# Patient Record
Sex: Female | Born: 1960
Health system: Southern US, Community
[De-identification: ages and names within clinical notes are randomized; demographics above are authoritative.]

## PROBLEM LIST (undated history)

## (undated) DIAGNOSIS — R519 Headache, unspecified: Secondary | ICD-10-CM

## (undated) DIAGNOSIS — M1990 Unspecified osteoarthritis, unspecified site: Secondary | ICD-10-CM

## (undated) DIAGNOSIS — E079 Disorder of thyroid, unspecified: Secondary | ICD-10-CM

## (undated) DIAGNOSIS — T7840XA Allergy, unspecified, initial encounter: Secondary | ICD-10-CM

## (undated) DIAGNOSIS — B019 Varicella without complication: Secondary | ICD-10-CM

## (undated) DIAGNOSIS — R51 Headache: Secondary | ICD-10-CM

## (undated) DIAGNOSIS — Z87440 Personal history of urinary (tract) infections: Secondary | ICD-10-CM

## (undated) DIAGNOSIS — E785 Hyperlipidemia, unspecified: Secondary | ICD-10-CM

## (undated) HISTORY — PX: TONSILLECTOMY AND ADENOIDECTOMY: SHX28

## (undated) HISTORY — DX: Personal history of urinary (tract) infections: Z87.440

## (undated) HISTORY — DX: Disorder of thyroid, unspecified: E07.9

## (undated) HISTORY — DX: Varicella without complication: B01.9

## (undated) HISTORY — PX: ABDOMINAL HYSTERECTOMY: SHX81

## (undated) HISTORY — DX: Unspecified osteoarthritis, unspecified site: M19.90

## (undated) HISTORY — DX: Headache, unspecified: R51.9

## (undated) HISTORY — DX: Headache: R51

## (undated) HISTORY — DX: Hyperlipidemia, unspecified: E78.5

## (undated) HISTORY — DX: Allergy, unspecified, initial encounter: T78.40XA

---

## 2011-01-08 LAB — HM MAMMOGRAPHY: HM Mammogram: NEGATIVE

## 2014-01-11 LAB — HM PAP SMEAR: HM Pap smear: NORMAL

## 2015-04-25 ENCOUNTER — Encounter (INDEPENDENT_AMBULATORY_CARE_PROVIDER_SITE_OTHER): Payer: Self-pay

## 2015-04-25 ENCOUNTER — Ambulatory Visit (INDEPENDENT_AMBULATORY_CARE_PROVIDER_SITE_OTHER): Payer: BLUE CROSS/BLUE SHIELD | Admitting: Nurse Practitioner

## 2015-04-25 ENCOUNTER — Encounter: Payer: Self-pay | Admitting: Nurse Practitioner

## 2015-04-25 VITALS — BP 130/88 | HR 79 | Temp 99.0°F | Resp 16 | Ht 68.0 in | Wt 240.0 lb

## 2015-04-25 DIAGNOSIS — Z7189 Other specified counseling: Secondary | ICD-10-CM

## 2015-04-25 DIAGNOSIS — R519 Headache, unspecified: Secondary | ICD-10-CM | POA: Insufficient documentation

## 2015-04-25 DIAGNOSIS — R51 Headache: Secondary | ICD-10-CM

## 2015-04-25 DIAGNOSIS — G4489 Other headache syndrome: Secondary | ICD-10-CM

## 2015-04-25 DIAGNOSIS — Z7689 Persons encountering health services in other specified circumstances: Secondary | ICD-10-CM | POA: Insufficient documentation

## 2015-04-25 MED ORDER — ONDANSETRON 4 MG PO TBDP: 4.0000 mg | ORAL_TABLET | Freq: Three times a day (TID) | ORAL | Status: DC | PRN

## 2015-04-25 MED ORDER — VALACYCLOVIR HCL 1 G PO TABS: ORAL_TABLET | ORAL | Status: DC

## 2015-04-25 NOTE — Progress Notes (Signed)
Patient ID: Lori Crawford, female    DOB: [birth year]  Age: 54 y.o. MRN: [id number]  CC: Establish Care   HPI Lori Crawford presents for establishing care and CC of headache x 1 day.    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  Excedrin and valtrex (out of this) Started yesterday  Has cold sores and genital herpes- no current rash Chills frequently, "flurry of pain" Frontal pressure and pain, described as throbbing. Severe she reports Excedrin- took edge off. Nauseated.   History Lori Crawford has a past medical history of Arthritis; Allergy; Chicken pox; Sinus headache; Hyperlipidemia; Thyroid disease; and History of frequent urinary tract infections.   She has past surgical history that includes Tonsillectomy and adenoidectomy.   Her family history is not on file. She was adopted.She reports that she has never smoked. She has never used smokeless tobacco. She reports that she drinks alcohol. She reports that she does not use illicit drugs.  No outpatient prescriptions prior to visit.   No facility-administered medications prior to visit.    ROS Review of Systems  Constitutional: Negative for fever, chills, diaphoresis and fatigue.  HENT: Positive for sinus pressure.   Respiratory: Negative for chest tightness, shortness of breath and wheezing.   Cardiovascular: Negative for chest pain, palpitations and leg swelling.  Gastrointestinal: Positive for nausea. Negative for vomiting and diarrhea.  Skin: Negative for rash.  Neurological: Positive for headaches. Negative for dizziness, weakness and numbness.  Psychiatric/Behavioral: The patient is not nervous/anxious.     Objective:  BP 130/88 mmHg  Pulse 79  Temp(Src) 99 F (37.2 C)  Resp 16  Ht  (1.727 m)  Wt 240 lb (108.863 kg)  BMI 36.50 kg/m2  SpO2 94%  Physical Exam  Constitutional: She is oriented to person, place, and time. She appears well-developed and well-nourished. No distress.   HENT:  Head: Normocephalic and atraumatic.  Right Ear: External ear normal.  Left Ear: External ear normal.  Eyes: EOM are normal. Right eye exhibits no discharge. Left eye exhibits no discharge. No scleral icterus.  Cardiovascular: Normal rate, regular rhythm and normal heart sounds.   Pulmonary/Chest: Effort normal and breath sounds normal. No respiratory distress. She has no wheezes. She has no rales. She exhibits no tenderness.  Neurological: She is alert and oriented to person, place, and time. No cranial nerve deficit. She exhibits normal muscle tone. Coordination normal.  Skin: Skin is warm and dry. No rash noted. She is not diaphoretic.  Psychiatric: She has a normal mood and affect. Her behavior is normal. Judgment and thought content normal.   Assessment & Plan:   Lori Crawford was seen today for establish care.  Diagnoses and all orders for this visit:  Encounter to establish care  Other headache syndrome  Other orders -     valACYclovir (VALTREX) 1000 MG tablet; Take 1 tablet by mouth twice daily for 3 days then take 1 tablet by mouth daily. -     ondansetron (ZOFRAN ODT) 4 MG disintegrating tablet; Take 1 tablet (4 mg total) by mouth every 8 (eight) hours as needed for nausea or vomiting.  I have changed Lori Crawford valACYclovir. I am also having her start on ondansetron. Additionally, I am having her maintain her levothyroxine.  Meds ordered this encounter  Medications  . levothyroxine (SYNTHROID, LEVOTHROID) 125 MCG tablet    Sig: Take 125 mcg by mouth daily before breakfast.  . DISCONTD: valACYclovir (VALTREX) 1000 MG tablet    Sig:  Take 1,000 mg by mouth 3 (three) times daily.  . valACYclovir (VALTREX) 1000 MG tablet    Sig: Take 1 tablet by mouth twice daily for 3 days then take 1 tablet by mouth daily.    Dispense:  30 tablet    Refill:  4    Order Specific Question:  Supervising Provider    Answer:  Duncan Dull L [[id number]]  . ondansetron (ZOFRAN ODT) 4 MG  disintegrating tablet    Sig: Take 1 tablet (4 mg total) by mouth every 8 (eight) hours as needed for nausea or vomiting.    Dispense:  20 tablet    Refill:  0    Order Specific Question:  Supervising Provider    Answer:  Sherlene Shams [[id number]]     Follow-up: Return in about 2 weeks (around 05/09/2015) for Est care finish visit and groin strain.

## 2015-04-25 NOTE — Progress Notes (Signed)
Pre visit review using our clinic review tool, if applicable. No additional management support is needed unless otherwise documented below in the visit note. 

## 2015-04-25 NOTE — Assessment & Plan Note (Signed)
Have paperwork. Will follow through with patient in 2 weeks. Did not get around to discussing anything with her due to painful headache.

## 2015-04-25 NOTE — Patient Instructions (Addendum)
Zofran- for nausea sent to pharmacy Valtrex- sent to pharmacy [facility] [address]   Call me if not improved tomorrow.

## 2015-04-25 NOTE — Assessment & Plan Note (Signed)
Worsening. Pt in visible pain. Filled Valtrex and sent in Zofran for nausea. Toradol 60 mg injection given to pt. Asked pt not to take NSAIDS for 24 hours. Pt denies having gastric surgery, hx of ulcers, or hx of bleeding/clotting disorders. No prior MI or Stroke.

## 2015-04-26 ENCOUNTER — Other Ambulatory Visit: Payer: Self-pay | Admitting: Nurse Practitioner

## 2015-04-26 ENCOUNTER — Telehealth: Payer: Self-pay | Admitting: *Deleted

## 2015-04-26 MED ORDER — KETOROLAC TROMETHAMINE 10 MG PO TABS: 10.0000 mg | ORAL_TABLET | Freq: Four times a day (QID) | ORAL | Status: DC | PRN

## 2015-04-26 NOTE — Telephone Encounter (Signed)
She can either come in for a nurse visit to get another shot or I can send it in for oral tablets to take every 6 hours at home (max 4 a day and not to exceed 5 days). Thanks!

## 2015-04-26 NOTE — Telephone Encounter (Signed)
Patient has requested to have another dose of toridol. Patient stated that she feels a slight improvement,  She also stated that she can walk around without major pain. -thanks

## 2015-04-26 NOTE — Telephone Encounter (Signed)
Pt would like to have the oral [facility] [address]

## 2015-04-27 ENCOUNTER — Telehealth: Payer: Self-pay | Admitting: Nurse Practitioner

## 2015-04-27 NOTE — Telephone Encounter (Signed)
Pt call to request a work note for being out of work from 9/19-9/21. Pt stated that she will be work only a half day 9/22 and 9/23. Please advise pt when ready/msn

## 2015-04-28 ENCOUNTER — Encounter: Payer: Self-pay | Admitting: Nurse Practitioner

## 2015-04-28 ENCOUNTER — Telehealth: Payer: Self-pay

## 2015-04-28 NOTE — Telephone Encounter (Signed)
It is ready. I will give to [facility] to put up front.

## 2015-04-28 NOTE — Telephone Encounter (Signed)
Informed pt that her work letter is ready and will be at the front desk for her to pick up

## 2015-08-18 ENCOUNTER — Telehealth: Payer: Self-pay | Admitting: Nurse Practitioner

## 2015-08-18 NOTE — Telephone Encounter (Signed)
Please advise 

## 2015-08-18 NOTE — Telephone Encounter (Signed)
She needs her TSH,CBC, CRP need orders please and Thank You!

## 2015-08-18 NOTE — Telephone Encounter (Signed)
Ok. I called pt and left a vm to call office.  °

## 2015-08-18 NOTE — Telephone Encounter (Signed)
Pt is scheduled for her follow up appt for Monday.

## 2015-08-18 NOTE — Telephone Encounter (Signed)
She will get labs at the time of the physical when she makes her appointment. I will not place orders in advance

## 2015-08-18 NOTE — Telephone Encounter (Signed)
Pt called would like to have blood work done. She needs her TSH,CBC, CRP to test for heart disease,cholestrol. Call pt @ [phone]. Need orders please and thank you! Thank You!

## 2015-08-18 NOTE — Telephone Encounter (Signed)
Pt called back and stated she is better and does not need the follow up appt. She would rather come in just for the physical and lab work. Thank You!

## 2015-08-18 NOTE — Telephone Encounter (Signed)
She never followed up. She needs an appointment and come fasting for labs.

## 2015-08-18 NOTE — Telephone Encounter (Signed)
Can you please schedule patient for a follow up OV and a lab appt.  thanks

## 2015-08-22 ENCOUNTER — Ambulatory Visit (INDEPENDENT_AMBULATORY_CARE_PROVIDER_SITE_OTHER): Payer: BLUE CROSS/BLUE SHIELD | Admitting: Nurse Practitioner

## 2015-08-22 ENCOUNTER — Other Ambulatory Visit: Payer: BLUE CROSS/BLUE SHIELD

## 2015-08-22 ENCOUNTER — Encounter: Payer: Self-pay | Admitting: Nurse Practitioner

## 2015-08-22 VITALS — BP 118/86 | HR 79 | Temp 98.7°F | Resp 16 | Ht 68.0 in | Wt 242.0 lb

## 2015-08-22 DIAGNOSIS — E890 Postprocedural hypothyroidism: Secondary | ICD-10-CM

## 2015-08-22 DIAGNOSIS — M7751 Other enthesopathy of right foot: Secondary | ICD-10-CM

## 2015-08-22 NOTE — Patient Instructions (Addendum)
Please visit the lab today.   Thank you for signing the medical release forms  Follow up for a physical exam with fasting labs.  Bursitis Bursitis is inflammation and irritation of a bursa, which is one of the small, fluid-filled sacs that cushion and protect the moving parts of your body. These sacs are located between bones and muscles, muscle attachments, or skin areas next to bones. A bursa protects these structures from the wear and tear that results from frequent movement. An inflamed bursa causes pain and swelling. Fluid may build up inside the sac. Bursitis is most common near joints, especially the knees, elbows, hips, and shoulders. CAUSES Bursitis can be caused by:   Injury from:  A direct blow, like falling on your knee or elbow.  Overuse of a joint (repetitive stress).  Infection. This can happen if bacteria gets into a bursa through a cut or scrape near a joint.  Diseases that cause joint inflammation, such as gout and rheumatoid arthritis. RISK FACTORS You may be at risk for bursitis if you:   Have a job or hobby that involves a lot of repetitive stress on your joints.  Have a condition that weakens your body's defense system (immune system), such as diabetes, cancer, or HIV.  Lift and reach overhead often.  Kneel or lean on hard surfaces often.  Run or walk often. SIGNS AND SYMPTOMS The most common signs and symptoms of bursitis are:  Pain that gets worse when you move the affected body part or put weight on it.  Inflammation.  Stiffness. Other signs and symptoms may include:  Redness.  Tenderness.  Warmth.  Pain that continues after rest.  Fever and chills. This may occur in bursitis caused by infection. DIAGNOSIS Bursitis may be diagnosed by:   Medical history and physical exam.  MRI.  A procedure to drain fluid from the bursa with a needle (aspiration). The fluid may be checked for signs of infection or gout.  Blood tests to rule out  other causes of inflammation. TREATMENT  Bursitis can usually be treated at home with rest, ice, compression, and elevation (RICE). For mild bursitis, RICE treatment may be all you need. Other treatments may include:  Nonsteroidal anti-inflammatory drugs (NSAIDs) to treat pain and inflammation.  Corticosteroids to fight inflammation. You may have these drugs injected into and around the area of bursitis.  Aspiration of bursitis fluid to relieve pain and improve movement.  Antibiotic medicine to treat an infected bursa.  A splint, brace, or walking aid.  Physical therapy if you continue to have pain or limited movement.  Surgery to remove a damaged or infected bursa. This may be needed if you have a very bad case of bursitis or if other treatments have not worked. HOME CARE INSTRUCTIONS   Take medicines only as directed by your health care provider.  If you were prescribed an antibiotic medicine, finish it all even if you start to feel better.  Rest the affected area as directed by your health care provider.  Keep the area elevated.  Avoid activities that make pain worse.  Apply ice to the injured area:  Place ice in a plastic bag.  Place a towel between your skin and the bag.  Leave the ice on for 20 minutes, 2-3 times a day.  Use splints, braces, pads, or walking aids as directed by your health care provider.  Keep all follow-up visits as directed by your health care provider. This is important. PREVENTION   Wear  knee pads if you kneel often.  Wear sturdy running or walking shoes that fit you well.  Take regular breaks from repetitive activity.  Warm up by stretching before doing any strenuous activity.  Maintain a healthy weight or lose weight as recommended by your health care provider. Ask your health care provider if you need help.  Exercise regularly. Start any new physical activity gradually. SEEK MEDICAL CARE IF:   Your bursitis is not responding to  treatment or home care.  You have a fever.  You have chills.   This information is not intended to replace advice given to you by your health care provider. Make sure you discuss any questions you have with your health care provider.   Document Released: 07/20/2000 Document Revised: 04/13/2015 Document Reviewed: 10/12/2013 Elsevier Interactive Patient Education [employer]

## 2015-08-22 NOTE — Progress Notes (Signed)
Patient ID: Lori Crawford, female    DOB: Nov 13, 1960  Age: 55 y.o. MRN: [id number]  CC: Establish Care and Hypothyroidism   HPI Lori Crawford presents for follow up of hypothyroidism and CC of heel tenderness on right.   1) Thyroid surgery in past  Endocrinologist in [place]  1 week left thyroid medication  Has not had labs in sometime.   2) Pt reports for a few weeks having tenderness with certain movements and pressure on heel of right foot. Feels a vibrating sensation with flexion and dorsiflexion of foot. Tenderness is mild-moderate  Side note: she is getting Married this summer  HPI  Lori Crawford has a past medical history of Arthritis; Allergy; Chicken pox; Sinus headache; Hyperlipidemia; Thyroid disease; and History of frequent urinary tract infections.   She has past surgical history that includes Tonsillectomy and adenoidectomy.   Her family history is not on file. She was adopted.She reports that she has never smoked. She has never used smokeless tobacco. She reports that she drinks alcohol. She reports that she does not use illicit drugs.  Outpatient Prescriptions Prior to Visit  Medication Sig Dispense Refill  . valACYclovir (VALTREX) 1000 MG tablet Take 1 tablet by mouth twice daily for 3 days then take 1 tablet by mouth daily. 30 tablet 4  . levothyroxine (SYNTHROID, LEVOTHROID) 125 MCG tablet Take 125 mcg by mouth daily before breakfast.    . ketorolac (TORADOL) 10 MG tablet Take 1 tablet (10 mg total) by mouth every 6 (six) hours as needed. Maximum 4 tablets a day for 5 days (Patient not taking: Reported on 08/22/2015) 20 tablet 0  . ondansetron (ZOFRAN ODT) 4 MG disintegrating tablet Take 1 tablet (4 mg total) by mouth every 8 (eight) hours as needed for nausea or vomiting. (Patient not taking: Reported on 08/22/2015) 20 tablet 0   No facility-administered medications prior to visit.    ROS Review of Systems  Constitutional: Negative for fever, chills, diaphoresis and  fatigue.  Respiratory: Negative for chest tightness, shortness of breath and wheezing.   Cardiovascular: Negative for chest pain, palpitations and leg swelling.  Gastrointestinal: Negative for nausea, vomiting and diarrhea.  Endocrine: Negative for cold intolerance and heat intolerance.  Musculoskeletal: Positive for arthralgias. Negative for joint swelling.  Skin: Negative for rash.  Neurological: Negative for dizziness, weakness, numbness and headaches.  Psychiatric/Behavioral: The patient is not nervous/anxious.     Objective:  BP 118/86 mmHg  Pulse 79  Temp(Src) 98.7 F (37.1 C) (Oral)  Resp 16  Ht 5\' 8"  (1.727 m)  Wt 242 lb (109.77 kg)  BMI 36.80 kg/m2  SpO2 98%  Physical Exam  Constitutional: She is oriented to person, place, and time. She appears well-developed and well-nourished. No distress.  HENT:  Head: Normocephalic and atraumatic.  Right Ear: External ear normal.  Left Ear: External ear normal.  Cardiovascular: Normal rate, regular rhythm and normal heart sounds.  Exam reveals no gallop and no friction rub.   No murmur heard. Pulmonary/Chest: Effort normal and breath sounds normal. No respiratory distress. She has no wheezes. She has no rales. She exhibits no tenderness.  Musculoskeletal: Normal range of motion. She exhibits tenderness. She exhibits no edema.  Right heel at achilles area  Tenderness around the retrocalcaneal bursa No swelling or loss of ROM  Neurological: She is alert and oriented to person, place, and time. No cranial nerve deficit. She exhibits normal muscle tone. Coordination normal.  Skin: Skin is warm and dry. No rash noted. She is  not diaphoretic.  Psychiatric: She has a normal mood and affect. Her behavior is normal. Judgment and thought content normal.   Assessment & Plan:   Paulena was seen today for establish care and hypothyroidism.  Diagnoses and all orders for this visit:  Retrocalcaneal bursitis, right  Postoperative  hypothyroidism -     TSH -     T4, free -     T3   I have discontinued Ms. Lynde's ondansetron and ketorolac. I am also having her maintain her valACYclovir.  No orders of the defined types were placed in this encounter.     Follow-up: Return if symptoms worsen or fail to improve.

## 2015-08-23 ENCOUNTER — Other Ambulatory Visit: Payer: Self-pay | Admitting: Nurse Practitioner

## 2015-08-23 LAB — T3: T3, Total: 93.7 ng/dL (ref 80.0–204.0)

## 2015-08-23 LAB — TSH: TSH: 3.84 u[IU]/mL (ref 0.35–4.50)

## 2015-08-23 LAB — T4, FREE: Free T4: 1.02 ng/dL (ref 0.60–1.60)

## 2015-08-23 MED ORDER — LEVOTHYROXINE SODIUM 125 MCG PO TABS: 125.0000 ug | ORAL_TABLET | Freq: Every day | ORAL | Status: DC

## 2015-08-25 ENCOUNTER — Encounter: Payer: BLUE CROSS/BLUE SHIELD | Admitting: Nurse Practitioner

## 2015-08-26 DIAGNOSIS — M7750 Other enthesopathy of unspecified foot: Secondary | ICD-10-CM

## 2015-08-26 DIAGNOSIS — E039 Hypothyroidism, unspecified: Secondary | ICD-10-CM | POA: Insufficient documentation

## 2015-08-26 HISTORY — DX: Other enthesopathy of unspecified foot and ankle: M77.50

## 2015-08-26 NOTE — Assessment & Plan Note (Signed)
New onset  Probable Retrocalcaneal bursitis due to tenderness of site.  Gave handout, RICE encouraged FU prn worsening/failure to improve.

## 2015-08-26 NOTE — Assessment & Plan Note (Signed)
New problem.  Need thyroid panel checked and if okay a refill on medications. H/o of thyroid surgery

## 2015-09-19 ENCOUNTER — Encounter: Payer: BLUE CROSS/BLUE SHIELD | Admitting: Nurse Practitioner

## 2015-11-20 ENCOUNTER — Other Ambulatory Visit: Payer: Self-pay | Admitting: Nurse Practitioner

## 2016-01-30 ENCOUNTER — Ambulatory Visit: Payer: BLUE CROSS/BLUE SHIELD | Admitting: Primary Care

## 2016-05-17 ENCOUNTER — Other Ambulatory Visit: Payer: Self-pay | Admitting: Nurse Practitioner

## 2016-05-30 ENCOUNTER — Ambulatory Visit: Payer: BLUE CROSS/BLUE SHIELD | Admitting: Primary Care

## 2016-07-24 ENCOUNTER — Other Ambulatory Visit: Payer: Self-pay | Admitting: Nurse Practitioner

## 2016-08-08 ENCOUNTER — Encounter: Payer: Self-pay | Admitting: Primary Care

## 2016-08-08 ENCOUNTER — Ambulatory Visit (INDEPENDENT_AMBULATORY_CARE_PROVIDER_SITE_OTHER): Payer: BLUE CROSS/BLUE SHIELD | Admitting: Primary Care

## 2016-08-08 ENCOUNTER — Other Ambulatory Visit: Payer: Self-pay | Admitting: Primary Care

## 2016-08-08 VITALS — BP 122/78 | HR 76 | Temp 98.1°F | Ht 68.0 in | Wt 236.8 lb

## 2016-08-08 DIAGNOSIS — B009 Herpesviral infection, unspecified: Secondary | ICD-10-CM | POA: Diagnosis not present

## 2016-08-08 DIAGNOSIS — E669 Obesity, unspecified: Secondary | ICD-10-CM | POA: Insufficient documentation

## 2016-08-08 DIAGNOSIS — E039 Hypothyroidism, unspecified: Secondary | ICD-10-CM

## 2016-08-08 LAB — COMPREHENSIVE METABOLIC PANEL
ALT: 19 U/L (ref 0–35)
AST: 19 U/L (ref 0–37)
Albumin: 4.1 g/dL (ref 3.5–5.2)
Alkaline Phosphatase: 64 U/L (ref 39–117)
BUN: 15 mg/dL (ref 6–23)
CO2: 28 mEq/L (ref 19–32)
Calcium: 9.2 mg/dL (ref 8.4–10.5)
Chloride: 107 mEq/L (ref 96–112)
Creatinine, Ser: 0.77 mg/dL (ref 0.40–1.20)
GFR: 100.57 mL/min (ref >60.00)
Glucose, Bld: 95 mg/dL (ref 70–99)
Potassium: 4.6 mEq/L (ref 3.5–5.1)
Sodium: 140 mEq/L (ref 135–145)
Total Bilirubin: 0.3 mg/dL (ref 0.2–1.2)
Total Protein: 7 g/dL (ref 6.0–8.3)

## 2016-08-08 LAB — TSH: TSH: 5.86 u[IU]/mL — ABNORMAL HIGH (ref 0.35–4.50)

## 2016-08-08 MED ORDER — LEVOTHYROXINE SODIUM 125 MCG PO TABS: ORAL_TABLET | ORAL | 1 refills | Status: DC

## 2016-08-08 MED ORDER — VALACYCLOVIR HCL 500 MG PO TABS: 500.0000 mg | ORAL_TABLET | Freq: Every day | ORAL | 3 refills | Status: DC

## 2016-08-08 NOTE — Progress Notes (Signed)
Subjective:    Patient ID: Lori Crawford, female    DOB: 1961-03-07, 56 y.o.   MRN: [id number]  HPI  Ms. Lori MaudlinGrimes is a 56 year old female who presents today [facility]. Her last physical was over 1 year ago.  1) Hypothyroidism: Diagnosed in her 40's. Currently managed on Levothyroxine 125 mcg. Her last TSH was stable in January 2017. She's not had a dose adjustment in several years. She is due for repeat TSH today. She does have a difficulty time losing weight. She denies fatigue, weakness, palpitations.  2) Herpes Simplex: Experiences lesions to buttocks and face, especially during stressful events. 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 She takes 500 mg of Valtrex daily for prevention and is needing a refill.   3) Obesity: She's struggled with obesity for the past 10-12 years after her divorce. She is trying to adapt a plant based diet that she started severl months ago. She doesn't count calories and does like to snack a lot at night.  She endorses a fair diet: Breakfast: Coffee, smoothie,  Lunch: Vegetables Dinner: Vegetables, occasional chicken Snack: Peanuts, chips, humus Desserts: Three times weekly Beverages: Coffee, smoothie, little water, soda (small can), occasional alcohol  Exercise: She does not currently exercise, but plans on exercising.   Review of Systems  Constitutional: Negative for fatigue.  Respiratory: Negative for shortness of breath.   Cardiovascular: Negative for chest pain.  Skin: Negative for rash.       Past Medical History:  Diagnosis Date  . Allergy    Seasonal   . Arthritis   . Chicken pox   . History of frequent urinary tract infections   . Hyperlipidemia   . Sinus headache   . Thyroid disease      Social History   Social History  . Marital status: Single    Spouse name: N/A  . Number of children: N/A  . Years of education: N/A   Occupational History  .  Not on file.   Social History Main Topics  . Smoking status: Never Smoker  . Smokeless tobacco: Never Used  . Alcohol use Yes  . Drug use: No  . Sexual activity: Not on file   Other Topics Concern  . Not on file   Social History Narrative  . No narrative on file    Past Surgical History:  Procedure Laterality Date  . TONSILLECTOMY AND ADENOIDECTOMY      Family History  Problem Relation Age of Onset  . Adopted: Yes    No Known Allergies  Current Outpatient Prescriptions on File Prior to Visit  Medication Sig Dispense Refill  . levothyroxine (SYNTHROID, LEVOTHROID) 125 MCG tablet TAKE 1 TABLET (125 MCG TOTAL) BY MOUTH DAILY BEFORE BREAKFAST. 90 tablet 1   No current facility-administered medications on file prior to visit.     BP 122/78   Pulse 76   Temp 98.1 F (36.7 C) (Oral)   Ht 5\' 8"  (1.727 m)   Wt 236 lb 12.8 oz (107.4 kg)   SpO2 97%   BMI 36.01 kg/m    Objective:   Physical Exam  Constitutional: She appears well-nourished.  Neck: Neck supple. No thyromegaly present.  Cardiovascular: Normal rate and regular rhythm.   Pulmonary/Chest: Effort normal and breath sounds normal.  Skin: Skin is warm and dry.  Psychiatric: She has a normal mood and affect.  Assessment & Plan:

## 2016-08-08 NOTE — Assessment & Plan Note (Signed)
History of genital herpes, also with lesions to face and chest wall. Managed on valtrex 500 mg daily, refill provided. No recent outbreaks.

## 2016-08-08 NOTE — Assessment & Plan Note (Signed)
Difficulty losing weight for years. Diet overall okay, but needs to eliminate sodas and snacking. discussed to start exercising daily. Discussed to start calorie tracking. Discussed weight loss MD in [place].

## 2016-08-08 NOTE — Patient Instructions (Signed)
Complete lab work prior to leaving today. I will notify you of your results once received.   I sent a prescription for Valtrex 500 mg tablets to your pharmacy. Take 1 tablet by mouth daily.  I will refill your levothyroxine once I receive your lab result.  Lori Crawford is the name of the weight loss management doctor in [place]. Please e-mail or call me if you need a referral.  Try counting your calories temporarily. Download the APP "My Fitness Pal".  Increase vegetables, fruit, whole grains, and water.  Ensure you are consuming 64 ounces of water daily.  Start exercising. You should be getting 150 minutes of moderate intensity exercise weekly.  It was a pleasure to meet you today! Please don't hesitate to call me with any questions. [facility]!

## 2016-08-08 NOTE — Assessment & Plan Note (Signed)
TSH due and pending. Exam unremarkable. Will refill levothyroxine once labs received.

## 2016-08-08 NOTE — Progress Notes (Signed)
Pre visit review using our clinic review tool, if applicable. No additional management support is needed unless otherwise documented below in the visit note. 

## 2016-09-14 ENCOUNTER — Other Ambulatory Visit (INDEPENDENT_AMBULATORY_CARE_PROVIDER_SITE_OTHER): Payer: BLUE CROSS/BLUE SHIELD

## 2016-09-14 ENCOUNTER — Encounter (INDEPENDENT_AMBULATORY_CARE_PROVIDER_SITE_OTHER): Payer: Self-pay

## 2016-09-14 DIAGNOSIS — E039 Hypothyroidism, unspecified: Secondary | ICD-10-CM

## 2016-09-14 LAB — TSH: TSH: 1.52 mIU/L

## 2016-10-16 ENCOUNTER — Other Ambulatory Visit: Payer: Self-pay | Admitting: Primary Care

## 2016-10-16 DIAGNOSIS — E039 Hypothyroidism, unspecified: Secondary | ICD-10-CM

## 2016-12-13 ENCOUNTER — Ambulatory Visit: Payer: BLUE CROSS/BLUE SHIELD | Admitting: Primary Care

## 2017-04-13 ENCOUNTER — Other Ambulatory Visit: Payer: Self-pay | Admitting: Primary Care

## 2017-04-13 DIAGNOSIS — E039 Hypothyroidism, unspecified: Secondary | ICD-10-CM

## 2017-06-27 ENCOUNTER — Encounter: Payer: Self-pay | Admitting: Emergency Medicine

## 2017-06-27 ENCOUNTER — Emergency Department: Payer: BLUE CROSS/BLUE SHIELD

## 2017-06-27 DIAGNOSIS — J181 Lobar pneumonia, unspecified organism: Secondary | ICD-10-CM | POA: Insufficient documentation

## 2017-06-27 DIAGNOSIS — J189 Pneumonia, unspecified organism: Secondary | ICD-10-CM | POA: Diagnosis not present

## 2017-06-27 DIAGNOSIS — R05 Cough: Secondary | ICD-10-CM | POA: Diagnosis not present

## 2017-06-27 DIAGNOSIS — Z79899 Other long term (current) drug therapy: Secondary | ICD-10-CM | POA: Diagnosis not present

## 2017-06-27 DIAGNOSIS — E039 Hypothyroidism, unspecified: Secondary | ICD-10-CM | POA: Diagnosis not present

## 2017-06-27 NOTE — ED Triage Notes (Signed)
Pt arrived via pov with complaints of persistant cough.. Pt spoke with teleMD today and was given a Zpack and benzonatate. Pt states she came in tonight because she has had no relief. Pt states symptoms started Monday.

## 2017-06-28 ENCOUNTER — Emergency Department
Admission: EM | Admit: 2017-06-28 | Discharge: 2017-06-28 | Disposition: A | Discharge status: Home / Self Care | Payer: BLUE CROSS/BLUE SHIELD | Attending: Emergency Medicine | Admitting: Emergency Medicine

## 2017-06-28 DIAGNOSIS — R05 Cough: Secondary | ICD-10-CM

## 2017-06-28 DIAGNOSIS — J181 Lobar pneumonia, unspecified organism: Secondary | ICD-10-CM

## 2017-06-28 DIAGNOSIS — R059 Cough, unspecified: Secondary | ICD-10-CM

## 2017-06-28 DIAGNOSIS — J189 Pneumonia, unspecified organism: Secondary | ICD-10-CM

## 2017-06-28 MED ORDER — GUAIFENESIN ER 600 MG PO TB12
600.0000 mg | ORAL_TABLET | Freq: Two times a day (BID) | ORAL | 0 refills | Status: DC
Start: 2017-06-28 — End: 2018-02-28

## 2017-06-28 MED ORDER — HYDROCOD POLST-CPM POLST ER 10-8 MG/5ML PO SUER: 5.0000 mL | Freq: Two times a day (BID) | ORAL | 0 refills | Status: DC

## 2017-06-28 MED ORDER — GUAIFENESIN ER 600 MG PO TB12
600.0000 mg | ORAL_TABLET | Freq: Two times a day (BID) | ORAL | Status: DC
  Administered 2017-06-28: 600 mg via ORAL
  Filled 2017-06-28 (×2): qty 1

## 2017-06-28 MED ORDER — IBUPROFEN 600 MG PO TABS
600.0000 mg | ORAL_TABLET | Freq: Once | ORAL | Status: AC
  Administered 2017-06-28: 600 mg via ORAL
  Filled 2017-06-28: qty 1

## 2017-06-28 MED ORDER — DM-GUAIFENESIN ER 30-600 MG PO TB12: 1.0000 | ORAL_TABLET | Freq: Two times a day (BID) | ORAL | Status: DC

## 2017-06-28 MED ORDER — DEXTROMETHORPHAN POLISTIREX ER 30 MG/5ML PO SUER
30.0000 mg | Freq: Two times a day (BID) | ORAL | Status: DC
  Administered 2017-06-28: 30 mg via ORAL
  Filled 2017-06-28 (×2): qty 5

## 2017-06-28 NOTE — ED Notes (Signed)
Notified pharmacy to send patient ordered medications

## 2017-06-28 NOTE — ED Provider Notes (Signed)
[facility] Emergency Department Provider Note   ____________________________________________   First MD Initiated Contact with Patient 06/28/17 0107     (approximate)  I have reviewed the triage vital signs and the nursing notes.   HISTORY  Chief Complaint Cough    HPI Lori Crawford is a 56 y.o. female who comes into the hospital today with a chronic cough.  She reports that she has been coughing so hard now she has pain in her back.  The cough started approximately 4 days ago.  She reports that 3 days ago she thought she was having some flulike symptoms with fevers at home.  She states that the fevers are better but today she called the tele-doc and she was prescribed a z pak and benzonatate.  The patient states that the cough medicine though has not been helping.  She did take a dose of the antibiotics.  She reports that she is fatigued and the cough takes her breath away.  She reports that her eyes are red and now starting to have some drainage.  The patient again was concerned so she decided to come into the hospital today for evaluation.  She denies any nausea or vomiting and denies shortness of breath.  She only has the pain in her back when she coughs.  She is here for evaluation.   Past Medical History:  Diagnosis Date  . Allergy    Seasonal   . Arthritis   . Chicken pox   . History of frequent urinary tract infections   . Hyperlipidemia   . Sinus headache   . Thyroid disease     Patient Active Problem List   Diagnosis Date Noted  . Herpes simplex 08/08/2016  . Obesity (BMI 35.0-39.9 without comorbidity) 08/08/2016  . Hypothyroidism 08/26/2015  . Retrocalcaneal bursitis 08/26/2015  . Headache 04/25/2015    Past Surgical History:  Procedure Laterality Date  . TONSILLECTOMY AND ADENOIDECTOMY      Prior to Admission medications   Medication Sig Start Date End Date Taking? Authorizing Provider  chlorpheniramine-HYDROcodone (TUSSIONEX  PENNKINETIC ER) 10-8 MG/5ML SUER Take 5 mLs by mouth 2 (two) times daily. 06/28/17   Rebecka ApleyWebster, Jammie Troup P, MD  guaiFENesin (MUCINEX) 600 MG 12 hr tablet Take 1 tablet (600 mg total) by mouth 2 (two) times daily. 06/28/17 06/28/18  Rebecka ApleyWebster, Natacha Jepsen P, MD  levothyroxine (SYNTHROID, LEVOTHROID) 125 MCG tablet TAKE 1 TABLET (125 MCG TOTAL) BY MOUTH DAILY BEFORE BREAKFAST. 04/15/17   Doreene Nestlark, Katherine K, NP  valACYclovir (VALTREX) 500 MG tablet Take 1 tablet (500 mg total) by mouth daily. 08/08/16   Doreene Nestlark, Katherine K, NP    Allergies Patient has no known allergies.  Family History  Adopted: Yes    Social History Social History   Tobacco Use  . Smoking status: Never Smoker  . Smokeless tobacco: Never Used  Substance Use Topics  . Alcohol use: Yes  . Drug use: No    Review of Systems  Constitutional: No fever/chills Eyes: No visual changes. ENT: No sore throat. Cardiovascular: Denies chest pain. Respiratory: Cough Gastrointestinal: No abdominal pain.  No nausea, no vomiting.  No diarrhea.  No constipation. Genitourinary: Negative for dysuria. Musculoskeletal: Right-sided back pain Skin: Negative for rash. Neurological: Negative for headaches, focal weakness or numbness.   ____________________________________________   PHYSICAL EXAM:  VITAL SIGNS: ED Triage Vitals  Enc Vitals Group     BP 06/27/17 2255 131/74     Pulse Rate 06/27/17 2255 96  Resp 06/27/17 2255 20     Temp 06/27/17 2255 100.1 F (37.8 C)     Temp Source 06/27/17 2255 Oral     SpO2 06/27/17 2255 98 %     Weight 06/27/17 2256 225 lb (102.1 kg)     Height 06/27/17 2256 5\' 8"  (1.727 m)     Head Circumference --      Peak Flow --      Pain Score 06/27/17 2255 0     Pain Loc --      Pain Edu? --      Excl. in GC? --     Constitutional: Alert and oriented. Well appearing and in mild distress. Eyes: Conjunctivae are normal. PERRL. EOMI. Head: Atraumatic. Nose: No congestion/rhinnorhea. Mouth/Throat:  Mucous membranes are moist.  Oropharynx non-erythematous. Cardiovascular: Normal rate, regular rhythm. Grossly normal heart sounds.  Good peripheral circulation. Respiratory: Normal respiratory effort.  No retractions. Mild crackles in RUL Gastrointestinal: Soft and nontender. No distention. Positive bowel sounds Musculoskeletal: No lower extremity tenderness nor edema.   Neurologic:  Normal speech and language.  Skin:  Skin is warm, dry and intact.  Psychiatric: Mood and affect are normal.   ____________________________________________   LABS (all labs ordered are listed, but only abnormal results are displayed)  Labs Reviewed - No data to display ____________________________________________  EKG  none ____________________________________________  RADIOLOGY  Dg Chest 2 View  Result Date: 06/27/2017 CLINICAL DATA:  Persistent cough starting on Monday. No relief with antibiotics today. EXAM: CHEST  2 VIEW COMPARISON:  None. FINDINGS: There is focal airspace disease in the inferior anterior right upper lung consistent with focal pneumonia. Left lung is clear. Heart size and pulmonary vascularity are normal. Slight blunting of the costophrenic angles suggesting small pleural effusions. No pneumothorax. Mediastinal contours appear intact. IMPRESSION: Focal infiltration in the inferior right upper lung consistent with pneumonia. Electronically Signed   By: Burman Nieves M.D.   On: 06/27/2017 23:14    ____________________________________________   PROCEDURES  Procedure(s) performed: None  Procedures  Critical Care performed: No  ____________________________________________   INITIAL IMPRESSION / ASSESSMENT AND PLAN / ED COURSE  As part of my medical decision making, I reviewed the following data within the electronic MEDICAL RECORD NUMBER Notes from prior ED visits and [place] Controlled Substance Database   This is a 57 year old female who comes into the hospital today with a  cough.  She reports that she has been having this since Monday and it is not gotten any better.  We did send the patient for a chest x-ray with a concern for possible pneumonia versus bronchitis.  The patient's chest x-ray showed a focal infiltration in the right inferior upper lung consistent with pneumonia.  I explained to the patient that this cough is due to her pneumonia.  She is on the appropriate antibiotic regimen but I will change her cough medicine.  The patient will receive a dose of Mucinex DM here in the emergency department.  I also gave her some ibuprofen for her pain.  The patient will be discharged home with some Tussionex and she should follow back up with her primary care physician.  The patient has no further complaints or concerns at this time.      ____________________________________________   FINAL CLINICAL IMPRESSION(S) / ED DIAGNOSES  Final diagnoses:  Community acquired pneumonia of right upper lobe of lung (HCC)  Cough     ED Discharge Orders        Ordered  chlorpheniramine-HYDROcodone (TUSSIONEX PENNKINETIC ER) 10-8 MG/5ML SUER  2 times daily     06/28/17 0142    guaiFENesin (MUCINEX) 600 MG 12 hr tablet  2 times daily     06/28/17 0142       Note:  This document was prepared using Dragon voice recognition software and may include unintentional dictation errors.    Rebecka ApleyWebster, Pauletta Pickney P, MD 06/28/17 [phone]

## 2017-06-28 NOTE — Discharge Instructions (Signed)
You have pneumonia. Please ensure that you are resting, using your humidifier and drinking adequate amounts of fluids. Please use tylenol and ibuprofen for your pain at home and please follow up with your primary care physician. Please return to the ED with any further complaints or concerns.

## 2017-07-01 ENCOUNTER — Other Ambulatory Visit: Payer: BLUE CROSS/BLUE SHIELD

## 2017-07-01 ENCOUNTER — Ambulatory Visit (INDEPENDENT_AMBULATORY_CARE_PROVIDER_SITE_OTHER): Payer: BLUE CROSS/BLUE SHIELD | Admitting: Family Medicine

## 2017-07-01 ENCOUNTER — Ambulatory Visit: Payer: Self-pay

## 2017-07-01 ENCOUNTER — Encounter: Payer: Self-pay | Admitting: Family Medicine

## 2017-07-01 VITALS — BP 122/80 | HR 78 | Temp 98.8°F | Wt 229.5 lb

## 2017-07-01 DIAGNOSIS — R05 Cough: Secondary | ICD-10-CM

## 2017-07-01 DIAGNOSIS — R062 Wheezing: Secondary | ICD-10-CM | POA: Diagnosis not present

## 2017-07-01 DIAGNOSIS — R059 Cough, unspecified: Secondary | ICD-10-CM

## 2017-07-01 DIAGNOSIS — J189 Pneumonia, unspecified organism: Secondary | ICD-10-CM

## 2017-07-01 MED ORDER — ALBUTEROL SULFATE (2.5 MG/3ML) 0.083% IN NEBU
2.5000 mg | INHALATION_SOLUTION | Freq: Once | RESPIRATORY_TRACT | Status: AC
  Administered 2017-07-01: 2.5 mg via RESPIRATORY_TRACT

## 2017-07-01 MED ORDER — ALBUTEROL SULFATE HFA 108 (90 BASE) MCG/ACT IN AERS: 2.0000 | INHALATION_SPRAY | RESPIRATORY_TRACT | 1 refills | Status: DC | PRN

## 2017-07-01 MED ORDER — IPRATROPIUM BROMIDE 0.02 % IN SOLN
0.5000 mg | Freq: Once | RESPIRATORY_TRACT | Status: AC
  Administered 2017-07-01: 0.5 mg via RESPIRATORY_TRACT

## 2017-07-01 NOTE — Progress Notes (Signed)
Subjective:    Patient ID: Lori Crawford, female    DOB: 07/30/1961, 56 y.o.   MRN: [id number]  HPI This is a 56 yo female who presents today for ER follow up. Lori Crawford did a tele visit and was given Z-pack and tessalon perles (did not help), Lori Crawford went to ER and was given Tussionex (good relief of cough at night) and guaifenesin. Lori Crawford is doing better, sputum production much reduced. Cough is dry. Has difficulty stopping cough once Lori Crawford starts, chest feels tight. Hearing wheezes.  Headache with coughing, no sore throat or ear pain. No fever.   Past Medical History:  Diagnosis Date  . Allergy    Seasonal   . Arthritis   . Chicken pox   . History of frequent urinary tract infections   . Hyperlipidemia   . Sinus headache   . Thyroid disease    Past Surgical History:  Procedure Laterality Date  . TONSILLECTOMY AND ADENOIDECTOMY     Family History  Adopted: Yes   Social History   Tobacco Use  . Smoking status: Never Smoker  . Smokeless tobacco: Never Used  Substance Use Topics  . Alcohol use: Yes  . Drug use: No      Review of Systems Per HPI    Objective:   Physical Exam  Constitutional: Lori Crawford is oriented to person, place, and time. Lori Crawford appears well-developed and well-nourished. No distress.  HENT:  Head: Normocephalic and atraumatic.  Right Ear: External ear normal.  Left Ear: External ear normal.  Nose: Nose normal.  Mouth/Throat: Oropharynx is clear and moist. No oropharyngeal exudate.  Eyes: Conjunctivae are normal.  Neck: Normal range of motion. Neck supple.  Cardiovascular: Normal rate, regular rhythm and normal heart sounds.  Pulmonary/Chest: Effort normal and breath sounds normal. No respiratory distress. Lori Crawford has no wheezes. Lori Crawford has no rales.  Frequent, deep, hacking, dry cough.   Lymphadenopathy:    Lori Crawford has no cervical adenopathy.  Neurological: Lori Crawford is alert and oriented to person, place, and time.  Skin: Skin is warm and dry. Lori Crawford is not diaphoretic.    Psychiatric: Lori Crawford has a normal mood and affect. Her behavior is normal. Judgment and thought content normal.  Vitals reviewed.     BP 122/80 (BP Location: Right Arm, Patient Position: Sitting, Cuff Size: Normal)   Pulse 78   Temp 98.8 F (37.1 C) (Oral)   Wt 229 lb 8 oz (104.1 kg)   SpO2 98%   BMI 34.90 kg/m  Wt Readings from Last 3 Encounters:  07/01/17 229 lb 8 oz (104.1 kg)  06/27/17 225 lb (102.1 kg)  08/08/16 236 lb 12.8 oz (107.4 kg)  Lori Crawford was given duoneb in office with some relief of cough and tightness     Assessment & Plan:  1. Community acquired pneumonia, unspecified laterality - Lori Crawford was instructed to finish antibiotic - will add albuterol inhaler, Lori Crawford was given written and verbal instructions for use - encouraged adequate fluid intake - RTC precautions reviewed - albuterol (PROVENTIL HFA;VENTOLIN HFA) 108 (90 Base) MCG/ACT inhaler; Inhale 2 puffs into the lungs every 4 (four) hours as needed for wheezing or shortness of breath (cough, shortness of breath or wheezing.).  Dispense: 1 Inhaler; Refill: 1 - albuterol (PROVENTIL) (2.5 MG/3ML) 0.083% nebulizer solution 2.5 mg - ipratropium (ATROVENT) nebulizer solution 0.5 mg  2. Cough - albuterol (PROVENTIL HFA;VENTOLIN HFA) 108 (90 Base) MCG/ACT inhaler; Inhale 2 puffs into the lungs every 4 (four) hours as needed for wheezing or shortness of  breath (cough, shortness of breath or wheezing.).  Dispense: 1 Inhaler; Refill: 1 - albuterol (PROVENTIL) (2.5 MG/3ML) 0.083% nebulizer solution 2.5 mg - ipratropium (ATROVENT) nebulizer solution 0.5 mg  3. Wheeze - albuterol (PROVENTIL HFA;VENTOLIN HFA) 108 (90 Base) MCG/ACT inhaler; Inhale 2 puffs into the lungs every 4 (four) hours as needed for wheezing or shortness of breath (cough, shortness of breath or wheezing.).  Dispense: 1 Inhaler; Refill: 1 - albuterol (PROVENTIL) (2.5 MG/3ML) 0.083% nebulizer solution 2.5 mg - ipratropium (ATROVENT) nebulizer solution 0.5  mg   Olean Reeeborah Kaicen Desena, FNP-BC   [facility]  07/03/2017 10:22 AM

## 2017-07-01 NOTE — Telephone Encounter (Signed)
Patient scheduled to see Deboraha Sprangebbie Gessner NP today at 11:45. Patient is aware.

## 2017-07-01 NOTE — Patient Instructions (Addendum)
Can use albuterol every 4-6 hours as needed for cough/ wheeze  For daytime cough, can use Delsym, Robitussin, cough lozenges   How to Use a Metered Dose Inhaler A metered dose inhaler is a handheld device for taking medicine that must be breathed into the lungs (inhaled). The device can be used to deliver a variety of inhaled medicines, including:  Quick relief or rescue medicines, such as bronchodilators.  Controller medicines, such as corticosteroids.  The medicine is delivered by pushing down on a metal canister to release a preset amount of spray and medicine. Each device contains the amount of medicine that is needed for a preset number of uses (inhalations). Your health care provider may recommend that you use a spacer with your inhaler to help you take the medicine more effectively. A spacer is a plastic tube with a mouthpiece on one end and an opening that connects to the inhaler on the other end. A spacer holds the medicine in a tube for a short time, which allows you to inhale more medicine. What are the risks? If you do not use your inhaler correctly, medicine might not reach your lungs to help you breathe. Inhaler medicine can cause side effects, such as:  Mouth or throat infection.  Cough.  Hoarseness.  Headache.  Nausea and vomiting.  Lung infection (pneumonia) in people who have a lung condition called COPD.  How to use a metered dose inhaler without a spacer 1. Remove the cap from the inhaler. 2. If you are using the inhaler for the first time, shake it for 5 seconds, turn it away from your face, then release 4 puffs into the air. This is called priming. 3. Shake the inhaler for 5 seconds. 4. Position the inhaler so the top of the canister faces up. 5. Put your index finger on the top of the medicine canister. Support the bottom of the inhaler with your thumb. 6. Breathe out normally and as completely as possible, away from the inhaler. 7. Either place the inhaler  between your teeth and close your lips tightly around the mouthpiece, or hold the inhaler 1-2 inches (2.5-5 cm) away from your open mouth. Keep your tongue down out of the way. If you are unsure which technique to use, ask your health care provider. 8. Press the canister down with your index finger to release the medicine, then inhale deeply and slowly through your mouth (not your nose) until your lungs are completely filled. Inhaling should take 4-6 seconds. 9. Hold the medicine in your lungs for 5-10 seconds (10 seconds is best). This helps the medicine get into the small airways of your lungs. 10. With your lips in a tight circle (pursed), breathe out slowly. 11. Repeat steps 3-10 until you have taken the number of puffs that your health care provider directed. Wait about 1 minute between puffs or as directed. 12. Put the cap on the inhaler. 13. If you are using a steroid inhaler, rinse your mouth with water, gargle, and spit out the water. Do not swallow the water. How to use a metered dose inhaler with a spacer 1. Remove the cap from the inhaler. 2. If you are using the inhaler for the first time, shake it for 5 seconds, turn it away from your face, then release 4 puffs into the air. This is called priming. 3. Shake the inhaler for 5 seconds. 4. Place the open end of the spacer onto the inhaler mouthpiece. 5. Position the inhaler so the top  of the canister faces up and the spacer mouthpiece faces you. 6. Put your index finger on the top of the medicine canister. Support the bottom of the inhaler and the spacer with your thumb. 7. Breathe out normally and as completely as possible, away from the spacer. 8. Place the spacer between your teeth and close your lips tightly around it. Keep your tongue down out of the way. 9. Press the canister down with your index finger to release the medicine, then inhale deeply and slowly through your mouth (not your nose) until your lungs are completely filled.  Inhaling should take 4-6 seconds. 10. Hold the medicine in your lungs for 5-10 seconds (10 seconds is best). This helps the medicine get into the small airways of your lungs. 11. With your lips in a tight circle (pursed), breathe out slowly. 12. Repeat steps 3-11 until you have taken the number of puffs that your health care provider directed. Wait about 1 minute between puffs or as directed. 13. Remove the spacer from the inhaler and put the cap on the inhaler. 14. If you are using a steroid inhaler, rinse your mouth with water, gargle, and spit out the water. Do not swallow the water. Follow these instructions at home:  Take your inhaled medicine only as told by your health care provider. Do not use the inhaler more than directed by your health care provider.  Keep all follow-up visits as told by your health care provider. This is important.  If your inhaler has a counter, you can check it to determine how full your inhaler is. If your inhaler does not have a counter, ask your health care provider when you will need to refill your inhaler and write the refill date on a calendar or on your inhaler canister. Note that you cannot know when an inhaler is empty by shaking it.  Follow directions on the package insert for care and cleaning of your inhaler and spacer. Contact a health care provider if:  Symptoms are only partially relieved with your inhaler.  You are having trouble using your inhaler.  You have an increase in phlegm.  You have headaches. Get help right away if:  You feel little or no relief after using your inhaler.  You have dizziness.  You have a fast heart rate.  You have chills or a fever.  You have night sweats.  There is blood in your phlegm. Summary  A metered dose inhaler is a handheld device for taking medicine that must be breathed into the lungs (inhaled).  The medicine is delivered by pushing down on a metal canister to release a preset amount of spray  and medicine.  Each device contains the amount of medicine that is needed for a preset number of uses (inhalations). This information is not intended to replace advice given to you by your health care provider. Make sure you discuss any questions you have with your health care provider. Document Released: 07/23/2005 Document Revised: 06/12/2016 Document Reviewed: 06/12/2016 Elsevier Interactive Patient Education  2017 Elsevier Inc.   Cough, Adult Coughing is a reflex that clears your throat and your airways. Coughing helps to heal and protect your lungs. It is normal to cough occasionally, but a cough that happens with other symptoms or lasts a long time may be a sign of a condition that needs treatment. A cough may last only 2-3 weeks (acute), or it may last longer than 8 weeks (chronic). What are the causes? Coughing is commonly caused  by:  Breathing in substances that irritate your lungs.  A viral or bacterial respiratory infection.  Allergies.  Asthma.  Postnasal drip.  Smoking.  Acid backing up from the stomach into the esophagus (gastroesophageal reflux).  Certain medicines.  Chronic lung problems, including COPD (or rarely, lung cancer).  Other medical conditions such as heart failure.  Follow these instructions at home: Pay attention to any changes in your symptoms. Take these actions to help with your discomfort:  Take medicines only as told by your health care provider. ? If you were prescribed an antibiotic medicine, take it as told by your health care provider. Do not stop taking the antibiotic even if you start to feel better. ? Talk with your health care provider before you take a cough suppressant medicine.  Drink enough fluid to keep your urine clear or pale yellow.  If the air is dry, use a cold steam vaporizer or humidifier in your bedroom or your home to help loosen secretions.  Avoid anything that causes you to cough at work or at home.  If your  cough is worse at night, try sleeping in a semi-upright position.  Avoid cigarette smoke. If you smoke, quit smoking. If you need help quitting, ask your health care provider.  Avoid caffeine.  Avoid alcohol.  Rest as needed.  Contact a health care provider if:  You have new symptoms.  You cough up pus.  Your cough does not get better after 2-3 weeks, or your cough gets worse.  You cannot control your cough with suppressant medicines and you are losing sleep.  You develop pain that is getting worse or pain that is not controlled with pain medicines.  You have a fever.  You have unexplained weight loss.  You have night sweats. Get help right away if:  You cough up blood.  You have difficulty breathing.  Your heartbeat is very fast. This information is not intended to replace advice given to you by your health care provider. Make sure you discuss any questions you have with your health care provider. Document Released: 01/19/2011 Document Revised: 12/29/2015 Document Reviewed: 09/29/2014 Elsevier Interactive Patient Education  2017 [employer].

## 2017-07-01 NOTE — Telephone Encounter (Signed)
Pt. Called reporting she was in ED Thanksgiving and diagnosed with pneumonia. Request follow up today or tomorrow.Reports her insurance will cancel after 07/02/17 due to change in employment.

## 2017-07-03 ENCOUNTER — Encounter: Payer: Self-pay | Admitting: Family Medicine

## 2017-07-17 ENCOUNTER — Ambulatory Visit: Payer: Self-pay | Admitting: *Deleted

## 2017-07-17 NOTE — Telephone Encounter (Signed)
  Reason for Disposition . [1] Finished taking antibiotics AND [2] symptoms improved (feels better)  (all other triage questions negative)  Answer Assessment - Initial Assessment Questions 1. SYMPTOMS: "What symptoms are you most concerned about?" "Is it better, the same, or worse compared to when you saw the doctor?"     Chest discomfort like a muscle strain. Was dx 3 weeks ago with pneumonia. 2. BREATHING DIFFICULTY: "Are you having any difficulty breathing?" If so, ask "How bad is it?"  (e.g., none, mild, moderate, severe)   - MILD: No SOB at rest, mild SOB with walking, speaks normally in sentences, can lay down, no retractions, pulse < 100.    - MODERATE: SOB at rest, SOB with minimal exertion and prefers to sit, cannot lie down flat, speaks in phrases, mild retractions, audible wheezing, pulse 100-120.    - SEVERE: Very SOB at rest, speaks in single words, struggling to breathe, sitting hunched forward, retractions, pulse > 120     No, only when in cold air 3. FEVER: "Do you have a fever?" If so, ask: "What is your temperature, how was it measured, and when did it start?"    no 4. SPUTUM: "Describe the color of your sputum" (clear, white, yellow, green, blood-tinged)     Non productive cough. Has taken mucinex without any success. 5. DIAGNOSIS CONFIRMATION: "When was the pneumonia diagnosed?" "By whom?"     Emergency room physician 6. ANTIBIOTIC: "Are you taking an antibiotic?"  If so, "Which one?" "When was it started?"    zithromax treatment 7. OTHER TREATMENT: "Are you receiving any other treatment for the pneumonia?" (e.g., albuterol nebulizer, oxygen) If so, ask, "How often?" and "Do they help?"    Albuterol inhaler not using regularly 8. HOSPITAL ADMISSION: "Were you hospitalized for this pneumonia?" If so, ask "When were you discharged home from the hospital?"     no  Protocols used: PNEUMONIA ON ANTIBIOTIC POST-HOSPITALIZATION FOLLOW-UP CALL-A-AH, PNEUMONIA ON ANTIBIOTIC  FOLLOW-UP CALL-A-AH

## 2017-08-11 ENCOUNTER — Other Ambulatory Visit: Payer: Self-pay | Admitting: Primary Care

## 2017-08-11 DIAGNOSIS — E039 Hypothyroidism, unspecified: Secondary | ICD-10-CM

## 2017-11-04 ENCOUNTER — Telehealth: Payer: Self-pay | Admitting: Primary Care

## 2017-11-04 NOTE — Telephone Encounter (Signed)
Copied from CRM [phone]#[id number]. Topic: Quick Communication - See Telephone Encounter >> Nov 04, 2017  8:40 AM Cipriano BunkerLambe, Annette S wrote: CRM for notification.   Pt. Called and [facility] # [id number]  Prescription Rx BIN  # [id number]  PCN [id number] [facility].  Issuer [phone]([id number])  - [phone],  ID [id number]; Changed Employer ; [facility]  All prescriptions need to go to [facility].. They faxed information to office and have not heard anything back.    See Telephone encounter for: 11/04/17.

## 2017-11-04 NOTE — Telephone Encounter (Signed)
Noted  

## 2018-02-14 ENCOUNTER — Other Ambulatory Visit: Payer: Self-pay | Admitting: Primary Care

## 2018-02-14 DIAGNOSIS — E039 Hypothyroidism, unspecified: Secondary | ICD-10-CM

## 2018-02-17 NOTE — Telephone Encounter (Signed)
Electronically refill request for levothyroxine (SYNTHROID, LEVOTHROID) 125 MCG tablet  Last prescribed on 08/12/2017  Last office visit on 08/08/2016. Last TSH on 09/14/2016

## 2018-02-17 NOTE — Telephone Encounter (Signed)
Please notify patient by phone (letter if no response) that this will be her last refill of levothyroxine until she's seen in our office. It's dangerous to treat without updated labs.

## 2018-02-19 NOTE — Telephone Encounter (Signed)
Spoken and notified patient of Graylon GunningKate Clark's comments. Patient verbalized understanding.  appointment has been schedule on 02/28/2018

## 2018-02-28 ENCOUNTER — Ambulatory Visit (INDEPENDENT_AMBULATORY_CARE_PROVIDER_SITE_OTHER)
Admission: RE | Admit: 2018-02-28 | Discharge: 2018-02-28 | Disposition: A | Discharge status: Home / Self Care | Payer: BLUE CROSS/BLUE SHIELD | Source: Ambulatory Visit | Attending: Primary Care | Admitting: Primary Care

## 2018-02-28 ENCOUNTER — Encounter: Payer: Self-pay | Admitting: Primary Care

## 2018-02-28 ENCOUNTER — Ambulatory Visit: Payer: Self-pay | Admitting: Primary Care

## 2018-02-28 DIAGNOSIS — M545 Low back pain: Secondary | ICD-10-CM | POA: Diagnosis not present

## 2018-02-28 DIAGNOSIS — G8929 Other chronic pain: Secondary | ICD-10-CM | POA: Insufficient documentation

## 2018-02-28 DIAGNOSIS — B009 Herpesviral infection, unspecified: Secondary | ICD-10-CM | POA: Diagnosis not present

## 2018-02-28 DIAGNOSIS — E890 Postprocedural hypothyroidism: Secondary | ICD-10-CM | POA: Diagnosis not present

## 2018-02-28 DIAGNOSIS — M549 Dorsalgia, unspecified: Secondary | ICD-10-CM

## 2018-02-28 MED ORDER — VALACYCLOVIR HCL 500 MG PO TABS: ORAL_TABLET | ORAL | 1 refills | Status: DC

## 2018-02-28 NOTE — Addendum Note (Signed)
Addended by: Alvina ChouWALSH, Maverick Patman J on: 02/28/2018 08:47 AM   Modules accepted: Orders

## 2018-02-28 NOTE — Assessment & Plan Note (Signed)
No outbreaks but does experience symptoms cyclically.  Will have her continue with 500 mg daily with an increase in dose to 1000 several days prior to symptoms begin. She will update.

## 2018-02-28 NOTE — Patient Instructions (Signed)
Stop by the lab and xray prior to leaving today. I will notify you of your results once received.   Take your levothyroxine with water only, every morning. Do not eat, drink coffee, or take other meds for 30 minutes.   Increase the dose of your Valtrex to 2 tablets 3 days prior to menstrual cycle time. Update me as needed.  It was a pleasure to see you today!   Back Exercises The following exercises strengthen the muscles that help to support the back. They also help to keep the lower back flexible. Doing these exercises can help to prevent back pain or lessen existing pain. If you have back pain or discomfort, try doing these exercises 2-3 times each day or as told by your health care provider. When the pain goes away, do them once each day, but increase the number of times that you repeat the steps for each exercise (do more repetitions). If you do not have back pain or discomfort, do these exercises once each day or as told by your health care provider. Exercises Single Knee to Chest  Repeat these steps 3-5 times for each leg: 1. Lie on your back on a firm bed or the floor with your legs extended. 2. Bring one knee to your chest. Your other leg should stay extended and in contact with the floor. 3. Hold your knee in place by grabbing your knee or thigh. 4. Pull on your knee until you feel a gentle stretch in your lower back. 5. Hold the stretch for 10-30 seconds. 6. Slowly release and straighten your leg.  Pelvic Tilt  Repeat these steps 5-10 times: 1. Lie on your back on a firm bed or the floor with your legs extended. 2. Bend your knees so they are pointing toward the ceiling and your feet are flat on the floor. 3. Tighten your lower abdominal muscles to press your lower back against the floor. This motion will tilt your pelvis so your tailbone points up toward the ceiling instead of pointing to your feet or the floor. 4. With gentle tension and even breathing, hold this position  for 5-10 seconds.  Cat-Cow  Repeat these steps until your lower back becomes more flexible: 1. Get into a hands-and-knees position on a firm surface. Keep your hands under your shoulders, and keep your knees under your hips. You may place padding under your knees for comfort. 2. Let your head hang down, and point your tailbone toward the floor so your lower back becomes rounded like the back of a cat. 3. Hold this position for 5 seconds. 4. Slowly lift your head and point your tailbone up toward the ceiling so your back forms a sagging arch like the back of a cow. 5. Hold this position for 5 seconds.  Press-Ups  Repeat these steps 5-10 times: 1. Lie on your abdomen (face-down) on the floor. 2. Place your palms near your head, about shoulder-width apart. 3. While you keep your back as relaxed as possible and keep your hips on the floor, slowly straighten your arms to raise the top half of your body and lift your shoulders. Do not use your back muscles to raise your upper torso. You may adjust the placement of your hands to make yourself more comfortable. 4. Hold this position for 5 seconds while you keep your back relaxed. 5. Slowly return to lying flat on the floor.  Bridges  Repeat these steps 10 times: 1. Lie on your back on a firm  surface. 2. Bend your knees so they are pointing toward the ceiling and your feet are flat on the floor. 3. Tighten your buttocks muscles and lift your buttocks off of the floor until your waist is at almost the same height as your knees. You should feel the muscles working in your buttocks and the back of your thighs. If you do not feel these muscles, slide your feet 1-2 inches farther away from your buttocks. 4. Hold this position for 3-5 seconds. 5. Slowly lower your hips to the starting position, and allow your buttocks muscles to relax completely.  If this exercise is too easy, try doing it with your arms crossed over your chest. Abdominal  Crunches  Repeat these steps 5-10 times: 1. Lie on your back on a firm bed or the floor with your legs extended. 2. Bend your knees so they are pointing toward the ceiling and your feet are flat on the floor. 3. Cross your arms over your chest. 4. Tip your chin slightly toward your chest without bending your neck. 5. Tighten your abdominal muscles and slowly raise your trunk (torso) high enough to lift your shoulder blades a tiny bit off of the floor. Avoid raising your torso higher than that, because it can put too much stress on your low back and it does not help to strengthen your abdominal muscles. 6. Slowly return to your starting position.  Back Lifts Repeat these steps 5-10 times: 1. Lie on your abdomen (face-down) with your arms at your sides, and rest your forehead on the floor. 2. Tighten the muscles in your legs and your buttocks. 3. Slowly lift your chest off of the floor while you keep your hips pressed to the floor. Keep the back of your head in line with the curve in your back. Your eyes should be looking at the floor. 4. Hold this position for 3-5 seconds. 5. Slowly return to your starting position.  Contact a health care provider if:  Your back pain or discomfort gets much worse when you do an exercise.  Your back pain or discomfort does not lessen within 2 hours after you exercise. If you have any of these problems, stop doing these exercises right away. Do not do them again unless your health care provider says that you can. Get help right away if:  You develop sudden, severe back pain. If this happens, stop doing the exercises right away. Do not do them again unless your health care provider says that you can. This information is not intended to replace advice given to you by your health care provider. Make sure you discuss any questions you have with your health care provider. Document Released: 08/30/2004 Document Revised: 11/30/2015 Document Reviewed:  09/16/2014 Elsevier Interactive Patient Education  2017 [employer].

## 2018-02-28 NOTE — Assessment & Plan Note (Signed)
Taking levothyroxine inappropriately, discussed to start taking with water only, no food, coffee, other meds for 30 min. Repeat TSH pending.
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
## 2018-03-01 LAB — COMPREHENSIVE METABOLIC PANEL
ALT: 17 IU/L (ref 0–32)
AST: 17 IU/L (ref 0–40)
Albumin/Globulin Ratio: 1.8 (ref 1.2–2.2)
Albumin: 4.4 g/dL (ref 3.5–5.5)
Alkaline Phosphatase: 72 IU/L (ref 39–117)
BUN/Creatinine Ratio: 26 — ABNORMAL HIGH (ref 9–23)
BUN: 22 mg/dL (ref 6–24)
Bilirubin Total: 0.3 mg/dL (ref 0.0–1.2)
CO2: 23 mmol/L (ref 20–29)
Calcium: 9.5 mg/dL (ref 8.7–10.2)
Chloride: 107 mmol/L — ABNORMAL HIGH (ref 96–106)
Creatinine, Ser: 0.84 mg/dL (ref 0.57–1.00)
GFR calc Af Amer: 90 mL/min/{1.73_m2} (ref >59)
GFR calc non Af Amer: 78 mL/min/{1.73_m2} (ref >59)
Globulin, Total: 2.4 g/dL (ref 1.5–4.5)
Glucose: 90 mg/dL (ref 65–99)
Potassium: 4.7 mmol/L (ref 3.5–5.2)
Sodium: 145 mmol/L — ABNORMAL HIGH (ref 134–144)
Total Protein: 6.8 g/dL (ref 6.0–8.5)

## 2018-03-01 LAB — TSH: TSH: 1.69 u[IU]/mL (ref 0.450–4.500)

## 2018-03-18 ENCOUNTER — Other Ambulatory Visit: Payer: Self-pay | Admitting: Primary Care

## 2018-03-18 DIAGNOSIS — E039 Hypothyroidism, unspecified: Secondary | ICD-10-CM

## 2018-04-21 DIAGNOSIS — M62830 Muscle spasm of back: Secondary | ICD-10-CM | POA: Diagnosis not present

## 2018-04-21 DIAGNOSIS — M5127 Other intervertebral disc displacement, lumbosacral region: Secondary | ICD-10-CM | POA: Diagnosis not present

## 2018-04-21 DIAGNOSIS — M9903 Segmental and somatic dysfunction of lumbar region: Secondary | ICD-10-CM | POA: Diagnosis not present

## 2018-04-21 DIAGNOSIS — M9904 Segmental and somatic dysfunction of sacral region: Secondary | ICD-10-CM | POA: Diagnosis not present

## 2018-04-22 ENCOUNTER — Telehealth: Payer: Self-pay | Admitting: Radiology

## 2018-04-22 DIAGNOSIS — M9903 Segmental and somatic dysfunction of lumbar region: Secondary | ICD-10-CM | POA: Diagnosis not present

## 2018-04-22 DIAGNOSIS — M9904 Segmental and somatic dysfunction of sacral region: Secondary | ICD-10-CM | POA: Diagnosis not present

## 2018-04-22 DIAGNOSIS — M62830 Muscle spasm of back: Secondary | ICD-10-CM | POA: Diagnosis not present

## 2018-04-22 DIAGNOSIS — M5127 Other intervertebral disc displacement, lumbosacral region: Secondary | ICD-10-CM | POA: Diagnosis not present

## 2018-04-22 NOTE — Telephone Encounter (Signed)
Copied from CRM [phone]. Topic: General - Other >> Apr 21, 2018  3:39 PM Angela NevinWilliams, Candice N wrote: Reason for CRM: Pt would like to know if she could pick up a physical copy of her latest xray from the office. Pt would like a call back at [phone]. Please advise.  Patient notified that CD is ready

## 2018-04-24 DIAGNOSIS — M5127 Other intervertebral disc displacement, lumbosacral region: Secondary | ICD-10-CM | POA: Diagnosis not present

## 2018-04-24 DIAGNOSIS — M9903 Segmental and somatic dysfunction of lumbar region: Secondary | ICD-10-CM | POA: Diagnosis not present

## 2018-04-24 DIAGNOSIS — M62830 Muscle spasm of back: Secondary | ICD-10-CM | POA: Diagnosis not present

## 2018-04-24 DIAGNOSIS — M9904 Segmental and somatic dysfunction of sacral region: Secondary | ICD-10-CM | POA: Diagnosis not present

## 2018-05-01 DIAGNOSIS — M62830 Muscle spasm of back: Secondary | ICD-10-CM | POA: Diagnosis not present

## 2018-05-01 DIAGNOSIS — M9904 Segmental and somatic dysfunction of sacral region: Secondary | ICD-10-CM | POA: Diagnosis not present

## 2018-05-01 DIAGNOSIS — M9903 Segmental and somatic dysfunction of lumbar region: Secondary | ICD-10-CM | POA: Diagnosis not present

## 2018-05-01 DIAGNOSIS — M5127 Other intervertebral disc displacement, lumbosacral region: Secondary | ICD-10-CM | POA: Diagnosis not present

## 2018-05-05 DIAGNOSIS — M9904 Segmental and somatic dysfunction of sacral region: Secondary | ICD-10-CM | POA: Diagnosis not present

## 2018-05-05 DIAGNOSIS — M5127 Other intervertebral disc displacement, lumbosacral region: Secondary | ICD-10-CM | POA: Diagnosis not present

## 2018-05-05 DIAGNOSIS — M62830 Muscle spasm of back: Secondary | ICD-10-CM | POA: Diagnosis not present

## 2018-05-05 DIAGNOSIS — M9903 Segmental and somatic dysfunction of lumbar region: Secondary | ICD-10-CM | POA: Diagnosis not present

## 2018-05-12 DIAGNOSIS — M5127 Other intervertebral disc displacement, lumbosacral region: Secondary | ICD-10-CM | POA: Diagnosis not present

## 2018-05-12 DIAGNOSIS — M9903 Segmental and somatic dysfunction of lumbar region: Secondary | ICD-10-CM | POA: Diagnosis not present

## 2018-05-12 DIAGNOSIS — M62830 Muscle spasm of back: Secondary | ICD-10-CM | POA: Diagnosis not present

## 2018-05-12 DIAGNOSIS — M9904 Segmental and somatic dysfunction of sacral region: Secondary | ICD-10-CM | POA: Diagnosis not present

## 2018-06-19 ENCOUNTER — Telehealth: Payer: Self-pay | Admitting: Primary Care

## 2018-06-19 DIAGNOSIS — E039 Hypothyroidism, unspecified: Secondary | ICD-10-CM

## 2018-06-19 NOTE — Telephone Encounter (Signed)
° ° ° °  1. Which medications need to be refilled? (please list name of each medication and dose if known) Levothyroxine 125 mcg   2. Which pharmacy/location (including street and city if local pharmacy) is medication to be sent to?[facility]

## 2018-06-20 MED ORDER — LEVOTHYROXINE SODIUM 125 MCG PO TABS: ORAL_TABLET | ORAL | 5 refills | Status: DC

## 2018-06-20 NOTE — Telephone Encounter (Signed)
[facility]

## 2018-08-27 ENCOUNTER — Ambulatory Visit: Payer: BLUE CROSS/BLUE SHIELD | Admitting: Primary Care

## 2018-08-27 ENCOUNTER — Encounter: Payer: Self-pay | Admitting: Primary Care

## 2018-08-27 DIAGNOSIS — F419 Anxiety disorder, unspecified: Secondary | ICD-10-CM | POA: Diagnosis not present

## 2018-08-27 NOTE — Assessment & Plan Note (Signed)
Secondary to overwhelming workload, symptoms for the last 6 months. Does not really fit the picture for generalized anxiety disorder, does not seem to have depression. Do agree that she has endured a lot of stress in her occupation due to the demands. GAD 7 score of 6 and PHQ 9 score of 5 today.  We discussed different options for treatment which include therapy versus medication.  She is interested in medication but would like to think about her options.   We talked about the SSRI group of medications specifically including duration of onset, side effects, expectations. She plans on sending a message via MyChart with her decision.

## 2018-08-27 NOTE — Progress Notes (Signed)
Subjective:    Patient ID: Lori Crawford, female    DOB: April 20, 1961, 58 y.o.   MRN: [id number]  HPI  [name] is a 58 year old female who presents today with a chief complaint of anxiety and work related stress.  She's been under a tremendous amount of stress since June 2019, workload has significantly increased. She's experienced symptoms of shaking on the inside, worry, feeling overwhelmed, procrastinating.  Symptoms have been present for the last 6+ months without improvement.  She's tried deep breathing exercises and praying without much improvement. She does have a good support system with her boyfriend for whom she speaks with regarding symptoms.  She denies feeling depressed, denies SI/HI.  GAD 7 score of 6 and PHQ 9 score of 5 today.  She was once managed on Xanax to help with symptoms during her father's illness.  Review of Systems  Respiratory: Negative for shortness of breath.   Cardiovascular: Negative for chest pain.  Neurological: Negative for dizziness.  Psychiatric/Behavioral: Negative for sleep disturbance and suicidal ideas. The patient is nervous/anxious.        See HPI       Past Medical History:  Diagnosis Date  . Allergy    Seasonal   . Arthritis   . Chicken pox   . History of frequent urinary tract infections   . Hyperlipidemia   . Sinus headache   . Thyroid disease      Social History   Socioeconomic History  . Marital status: Single    Spouse name: Not on file  . Number of children: Not on file  . Years of education: Not on file  . Highest education level: Not on file  Occupational History  . Not on file  Social Needs  . Financial resource strain: Not on file  . Food insecurity:    Worry: Not on file    Inability: Not on file  . Transportation needs:    Medical: Not on file    Non-medical: Not on file  Tobacco Use  . Smoking status: Never Smoker  . Smokeless tobacco: Never Used  Substance and Sexual Activity  . Alcohol use: Yes  .  Drug use: No  . Sexual activity: Not on file  Lifestyle  . Physical activity:    Days per week: Not on file    Minutes per session: Not on file  . Stress: Not on file  Relationships  . Social connections:    Talks on phone: Not on file    Gets together: Not on file    Attends religious service: Not on file    Active member of club or organization: Not on file    Attends meetings of clubs or organizations: Not on file    Relationship status: Not on file  . Intimate partner violence:    Fear of current or ex partner: Not on file    Emotionally abused: Not on file    Physically abused: Not on file    Forced sexual activity: Not on file  Other Topics Concern  . Not on file  Social History Narrative  . Not on file    Past Surgical History:  Procedure Laterality Date  . TONSILLECTOMY AND ADENOIDECTOMY      Family History  Adopted: Yes    No Known Allergies  Current Outpatient Medications on File Prior to Visit  Medication Sig Dispense Refill  . levothyroxine (SYNTHROID, LEVOTHROID) 125 MCG tablet TAKE 1 TABLET (125 MCG TOTAL) BY MOUTH  DAILY BEFORE BREAKFAST. 30 tablet 5  . valACYclovir (VALTREX) 500 MG tablet Take 1-2 tablets once daily for outbreak prevention. 180 tablet 1   No current facility-administered medications on file prior to visit.     BP 122/70   Pulse 80   Temp 98 F (36.7 C) (Oral)   Ht 5\' 8"  (1.727 m)   Wt 231 lb 8 oz (105 kg)   SpO2 98%   BMI 35.20 kg/m    Objective:   Physical Exam  Constitutional: She appears well-nourished.  Neck: Neck supple.  Cardiovascular: Normal rate and regular rhythm.  Respiratory: Effort normal and breath sounds normal.  Skin: Skin is warm and dry.  Psychiatric: She has a normal mood and affect.           Assessment & Plan:

## 2018-08-27 NOTE — Patient Instructions (Addendum)
Please notify me if you are interested in medication and/or therapy treatment for anxiety.  Options include sertraline (Zoloft), escitalopram (Lexapro), citalopram (Celexa), fluoxetine (Prozac).  Make sure to take some time for yourself.   It was a pleasure to see you today!

## 2018-10-01 DIAGNOSIS — R208 Other disturbances of skin sensation: Secondary | ICD-10-CM | POA: Diagnosis not present

## 2018-10-01 DIAGNOSIS — D2261 Melanocytic nevi of right upper limb, including shoulder: Secondary | ICD-10-CM | POA: Diagnosis not present

## 2018-10-01 DIAGNOSIS — D2262 Melanocytic nevi of left upper limb, including shoulder: Secondary | ICD-10-CM | POA: Diagnosis not present

## 2018-10-01 DIAGNOSIS — D2272 Melanocytic nevi of left lower limb, including hip: Secondary | ICD-10-CM | POA: Diagnosis not present

## 2018-10-01 DIAGNOSIS — D485 Neoplasm of uncertain behavior of skin: Secondary | ICD-10-CM | POA: Diagnosis not present

## 2018-10-01 DIAGNOSIS — D224 Melanocytic nevi of scalp and neck: Secondary | ICD-10-CM | POA: Diagnosis not present

## 2018-10-01 DIAGNOSIS — L538 Other specified erythematous conditions: Secondary | ICD-10-CM | POA: Diagnosis not present

## 2018-10-01 DIAGNOSIS — D225 Melanocytic nevi of trunk: Secondary | ICD-10-CM | POA: Diagnosis not present

## 2018-10-08 ENCOUNTER — Telehealth: Payer: Self-pay

## 2018-10-08 DIAGNOSIS — E039 Hypothyroidism, unspecified: Secondary | ICD-10-CM

## 2018-10-08 DIAGNOSIS — B009 Herpesviral infection, unspecified: Secondary | ICD-10-CM

## 2018-10-08 MED ORDER — LEVOTHYROXINE SODIUM 125 MCG PO TABS: ORAL_TABLET | ORAL | 1 refills | Status: DC

## 2018-10-08 MED ORDER — VALACYCLOVIR HCL 500 MG PO TABS: ORAL_TABLET | ORAL | 0 refills | Status: DC

## 2018-10-08 NOTE — Telephone Encounter (Signed)
Pt left v/m; pt thought at the last visit she requested refills of levothyroxine and valacyclovir to [facility] by the CMA. Pt is out of valacyclovir and request cb. Pt last seen 08/27/18 for anxiety.

## 2018-10-08 NOTE — Telephone Encounter (Signed)
Spoken to patient and sent to refill as requested. [facility].

## 2018-10-23 ENCOUNTER — Other Ambulatory Visit: Payer: Self-pay | Admitting: Primary Care

## 2018-10-23 DIAGNOSIS — B009 Herpesviral infection, unspecified: Secondary | ICD-10-CM

## 2018-12-20 ENCOUNTER — Other Ambulatory Visit: Payer: Self-pay | Admitting: Primary Care

## 2018-12-20 DIAGNOSIS — B009 Herpesviral infection, unspecified: Secondary | ICD-10-CM

## 2018-12-23 ENCOUNTER — Telehealth: Payer: Self-pay

## 2018-12-23 NOTE — Telephone Encounter (Signed)
Copied from CRM [id number]. Topic: Appointment Scheduling - Scheduling Inquiry for Clinic >> Dec 22, 2018  4:03 PM Reggie Pile, [place] wrote: Reason for CRM: Patient is calling in to set up an appointment, as per letter she received. Call back is [phone].

## 2018-12-24 ENCOUNTER — Telehealth: Payer: Self-pay | Admitting: Primary Care

## 2018-12-24 ENCOUNTER — Other Ambulatory Visit: Payer: Self-pay | Admitting: Primary Care

## 2018-12-24 DIAGNOSIS — E890 Postprocedural hypothyroidism: Secondary | ICD-10-CM

## 2018-12-24 DIAGNOSIS — Z0000 Encounter for general adult medical examination without abnormal findings: Secondary | ICD-10-CM

## 2018-12-24 NOTE — Telephone Encounter (Signed)
Pt scheduled her cpx 6/5 needs labs order sent to [facility]

## 2018-12-24 NOTE — Telephone Encounter (Signed)
Noted, orders placed. 

## 2018-12-24 NOTE — Telephone Encounter (Signed)
lvm asking pt to call office °

## 2019-01-09 ENCOUNTER — Encounter: Payer: Self-pay | Admitting: Primary Care

## 2019-01-09 ENCOUNTER — Telehealth: Payer: Self-pay

## 2019-01-09 ENCOUNTER — Ambulatory Visit (INDEPENDENT_AMBULATORY_CARE_PROVIDER_SITE_OTHER): Payer: BC Managed Care – PPO | Admitting: Primary Care

## 2019-01-09 VITALS — BP 139/72 | HR 72 | Temp 97.6°F | Wt 231.0 lb

## 2019-01-09 DIAGNOSIS — Z0001 Encounter for general adult medical examination with abnormal findings: Secondary | ICD-10-CM | POA: Insufficient documentation

## 2019-01-09 DIAGNOSIS — Z1211 Encounter for screening for malignant neoplasm of colon: Secondary | ICD-10-CM

## 2019-01-09 DIAGNOSIS — G8929 Other chronic pain: Secondary | ICD-10-CM

## 2019-01-09 DIAGNOSIS — Z0000 Encounter for general adult medical examination without abnormal findings: Secondary | ICD-10-CM | POA: Diagnosis not present

## 2019-01-09 DIAGNOSIS — E890 Postprocedural hypothyroidism: Secondary | ICD-10-CM | POA: Diagnosis not present

## 2019-01-09 DIAGNOSIS — Z1159 Encounter for screening for other viral diseases: Secondary | ICD-10-CM | POA: Diagnosis not present

## 2019-01-09 DIAGNOSIS — B009 Herpesviral infection, unspecified: Secondary | ICD-10-CM | POA: Diagnosis not present

## 2019-01-09 DIAGNOSIS — M545 Low back pain: Secondary | ICD-10-CM

## 2019-01-09 NOTE — Assessment & Plan Note (Signed)
Tetanus due, will have her come by the office. Mammogram overdue, strongly advised she move towards having this done. She will think about this.  Colonoscopy due, referral placed. Encouraged she continue to work on diet and exercise, increase water. Virtual exam unremarkable. Labs pending for [facility]. Follow up in 1 year for CPE

## 2019-01-09 NOTE — Telephone Encounter (Signed)
Noted  

## 2019-01-09 NOTE — Progress Notes (Signed)
Subjective:    Patient ID: Lori Crawford Brotzman, female    DOB: Aug 08, 1960, 58 y.o.   MRN: [id number]  HPI  Virtual Visit via Video Note  I connected with Lori Crawford Sirianni on 01/09/19 at  9:00 AM EDT by a video enabled telemedicine application and verified that I am speaking with the correct person using two identifiers.  Location: Patient: Home Provider: Office   I discussed the limitations of evaluation and management by telemedicine and the availability of in person appointments. The patient expressed understanding and agreed to proceed.  History of Present Illness:  Virtual Visit via Video Note  I connected with Lori Crawford Reise on 01/09/19 at  9:00 AM EDT by a video enabled telemedicine application and verified that I am speaking with the correct person using two identifiers.  Location: Patient: Home Provider: Office   I discussed the limitations of evaluation and management by telemedicine and the availability of in person appointments. The patient expressed understanding and agreed to proceed.  History of Present Illness:  Virtual Visit via Video Note  I connected with Lori Crawford Crysler on 01/09/19 at  9:00 AM EDT by a video enabled telemedicine application and verified that I am speaking with the correct person using two identifiers.  Location: Patient: Home Provider: Office   I discussed the limitations of evaluation and management by telemedicine and the availability of in person appointments. The patient expressed understanding and agreed to proceed.  History of Present Illness:  Ms. Lori Crawford is a 58 year old female who presents today for complete physical.  Immunizations: -Tetanus: Due -Influenza: Never completes   Diet: She endorses a healthy diet with meat, vegetables, starch. She is starting to exercise three times weekly. She drinks little water, mostly crystal light, wine, sometimes soda  Eye exam: Completed in 2020 Dental exam: Completes four times annually   Colonoscopy: Never completed, failed attempt years ago Pap Smear: Hysterecomty Mammogram: No recent exam.  Hep C Screen: Pending    Observations/Objective:  Alert and oriented. Appears well, not sickly. No distress. Speaking in complete sentences.   Assessment and Plan:  See problem based charting.  Follow Up Instructions:  You will be contacted regarding your referral to GI for colonoscopy.  Please let us know if you have not been contacted within one week.   Please think about getting a mammogram done.  It's important to improve your diet by reducing consumption of fast food, fried food, processed snack foods, sugary drinks. Increase consumption of fresh vegetables and fruits, whole grains, water.  Ensure you are drinking 64 ounces of water daily.  Continue exercising. You should be getting 150 minutes of moderate intensity exercise weekly.  Call our main line to schedule a tetanus vaccination.  Be sure to take your levothyroxine (thyroid medication) every morning on an empty stomach with water only. No food or other medications for 30 minutes. No heartburn medication, iron pills, calcium, vitamin D, or magnesium pills within four hours of taking levothyroxine.   I'll be in touch once I receive your lab reports.  It was a pleasure to see you today!    I discussed the assessment and treatment plan with the patient. The patient was provided an opportunity to ask questions and all were answered. The patient agreed with the plan and demonstrated an understanding of the instructions.   The patient was advised to call back or seek an in-person evaluation if the symptoms worsen or if the condition fails to improve as anticipated.  [name], NP        Review of Systems  Constitutional: Negative for unexpected weight change.  HENT: Negative for rhinorrhea.   Respiratory: Negative for cough and shortness of breath.   Cardiovascular: Negative for chest  pain.  Gastrointestinal: Negative for constipation and diarrhea.  Genitourinary: Negative for difficulty urinating.  Musculoskeletal: Positive for arthralgias.       Chronic back pain, mostly stiff in the morning.   Skin: Negative for rash.  Allergic/Immunologic: Positive for environmental allergies.  Neurological: Negative for dizziness, numbness and headaches.  Psychiatric/Behavioral: The patient is not nervous/anxious.        Past Medical History:  Diagnosis Date  . Allergy    Seasonal   . Arthritis   . Chicken pox   . History of frequent urinary tract infections   . Hyperlipidemia   . Sinus headache   . Thyroid disease      Social History   Socioeconomic History  . Marital status: Single    Spouse name: Not on file  . Number of children: Not on file  . Years of education: Not on file  . Highest education level: Not on file  Occupational History  . Not on file  Social Needs  . Financial resource strain: Not on file  . Food insecurity:    Worry: Not on file    Inability: Not on file  . Transportation needs:    Medical: Not on file    Non-medical: Not on file  Tobacco Use  . Smoking status: Never Smoker  . Smokeless tobacco: Never Used  Substance and Sexual Activity  . Alcohol use: Yes  . Drug use: No  . Sexual activity: Not on file  Lifestyle  . Physical activity:    Days per week: Not on file    Minutes per session: Not on file  . Stress: Not on file  Relationships  . Social connections:    Talks on phone: Not on file    Gets together: Not on file    Attends religious service: Not on file    Active member of club or organization: Not on file    Attends meetings of clubs or organizations: Not on file    Relationship status: Not on file  . Intimate partner violence:    Fear of current or ex partner: Not on file    Emotionally abused: Not on file    Physically abused: Not on file    Forced sexual activity: Not on file  Other Topics Concern  . Not on  file  Social History Narrative  . Not on file    Past Surgical History:  Procedure Laterality Date  . TONSILLECTOMY AND ADENOIDECTOMY      Family History  Adopted: Yes    No Known Allergies  Current Outpatient Medications on File Prior to Visit  Medication Sig Dispense Refill  . levothyroxine (SYNTHROID, LEVOTHROID) 125 MCG tablet Take 1 tablet by mouth every morning on an empty stomach with a full glass of water. No food or other medications for 30 minutes. 90 tablet 1  . valACYclovir (VALTREX) 500 MG tablet TAKE 1 TO 2 TABLETS BY  MOUTH ONCE DAILY FOR  OUTBREAK PREVENTION NEED APPOINTMENT FOR ANY MORE REFILLS 180 tablet 0   No current facility-administered medications on file prior to visit.     BP 139/72   Pulse 72   Temp 97.6 F (36.4 C) (Oral)   Wt 231 lb (104.8 kg)   BMI  35.12 kg/m    Objective:   Physical Exam  Constitutional: She is oriented to person, place, and time. She appears well-nourished.  HENT:  Head: Normocephalic.  Respiratory: Effort normal.  Musculoskeletal: Normal range of motion.  Neurological: She is alert and oriented to person, place, and time.  Skin: Skin is dry.  Psychiatric: She has a normal mood and affect.           Assessment & Plan:

## 2019-01-09 NOTE — Patient Instructions (Signed)
You will be contacted regarding your referral to GI for colonoscopy.  Please let us know if you have not been contacted within one week.   Please think about getting a mammogram done.  It's important to improve your diet by reducing consumption of fast food, fried food, processed snack foods, sugary drinks. Increase consumption of fresh vegetables and fruits, whole grains, water.  Ensure you are drinking 64 ounces of water daily.  Continue exercising. You should be getting 150 minutes of moderate intensity exercise weekly.  Call our main line to schedule a tetanus vaccination.  Be sure to take your levothyroxine (thyroid medication) every morning on an empty stomach with water only. No food or other medications for 30 minutes. No heartburn medication, iron pills, calcium, vitamin D, or magnesium pills within four hours of taking levothyroxine.   I'll be in touch once I receive your lab reports.  It was a pleasure to see you today!

## 2019-01-09 NOTE — Assessment & Plan Note (Signed)
Compliant to levothyroxine, taking with water or coffee. Also takes it with her Valtrex. Discussed proper instructions for taking levothyroxine. Repeat TSH pending.

## 2019-01-09 NOTE — Assessment & Plan Note (Signed)
Taking valtrex 1000 mg once daily for prevention for the last one month. This is an increase from what she has done before which was 1 tablet. She has noticed a difference when taking 1000 mg daily. No recent breakouts. Continue same.

## 2019-01-09 NOTE — Assessment & Plan Note (Signed)
Improved with increased exercise and activity. Continue same.

## 2019-01-09 NOTE — Telephone Encounter (Signed)
[facility] Night - Client Nonclinical Telephone Record AccessNurse Client [facility] Night - Client Client Site [facility] [place] - Night Physician Vernona Rieger - NP Contact Type Call Who Is Calling Patient / Member / Family / Caregiver Caller Name [name] Caller Phone Number [phone] Patient Name Lori Crawford Patient DOB 18-Jul-1961 Call Type Message Only Information Provided Reason for Call Request for General Office Information Initial Comment Caller states she is calling to confirm appt. and needs to know what to do. Additional Comment Call Closed By: Josephina Shih Transaction Date/Time: 01/09/2019 7:47:13 AM

## 2019-01-09 NOTE — Telephone Encounter (Signed)
Pt already had virtual appt earlier this AM.

## 2019-01-15 ENCOUNTER — Ambulatory Visit (INDEPENDENT_AMBULATORY_CARE_PROVIDER_SITE_OTHER): Payer: BC Managed Care – PPO | Admitting: *Deleted

## 2019-01-15 DIAGNOSIS — Z23 Encounter for immunization: Secondary | ICD-10-CM

## 2019-01-15 NOTE — Progress Notes (Signed)
Allie Bossier, NP out of the office so Dr. Damita Dunnings approved vaccine

## 2019-01-19 ENCOUNTER — Encounter: Payer: Self-pay | Admitting: *Deleted

## 2019-01-28 DIAGNOSIS — Z03818 Encounter for observation for suspected exposure to other biological agents ruled out: Secondary | ICD-10-CM | POA: Diagnosis not present

## 2019-02-02 ENCOUNTER — Encounter: Payer: Self-pay | Admitting: Family Medicine

## 2019-02-02 ENCOUNTER — Ambulatory Visit: Payer: BC Managed Care – PPO | Admitting: Family Medicine

## 2019-02-02 ENCOUNTER — Other Ambulatory Visit: Payer: Self-pay

## 2019-02-02 VITALS — BP 124/78 | HR 71 | Temp 97.7°F | Ht 68.0 in | Wt 234.0 lb

## 2019-02-02 DIAGNOSIS — S29012A Strain of muscle and tendon of back wall of thorax, initial encounter: Secondary | ICD-10-CM | POA: Insufficient documentation

## 2019-02-02 HISTORY — DX: Strain of muscle and tendon of back wall of thorax, initial encounter: S29.012A

## 2019-02-02 MED ORDER — METHOCARBAMOL 500 MG PO TABS: 500.0000 mg | ORAL_TABLET | Freq: Two times a day (BID) | ORAL | 0 refills | Status: DC | PRN

## 2019-02-02 NOTE — Assessment & Plan Note (Addendum)
Anticipate R rhomboid strain after starting new workout routine. Supportive care reviewed as per instructions - heating pad, tylenol, robaxin, stretching exercises from [facility] pt advisor. Update if not improving with treatment. Pt agrees with plan. Discussed sedation precautions with muscle relaxant.

## 2019-02-02 NOTE — Progress Notes (Signed)
This visit was conducted in person.  BP 124/78 (BP Location: Left Arm, Patient Position: Sitting, Cuff Size: Large)   Pulse 71   Temp 97.7 F (36.5 C) (Temporal)   Ht 5\' 8"  (1.727 m)   Wt 234 lb (106.1 kg)   SpO2 97%   BMI 35.58 kg/m    CC: R shoulder pain Subjective:    Patient ID: Lori Crawford, female    DOB: [birth year], 58 y.o.   MRN: [id number]  HPI: Lori Crawford is a 58 y.o. female presenting on 02/02/2019 for Shoulder Pain (C/o pain under right shoulder pain. Started about 1 wk ago. Tried Motrin and heating pad. Heating pad helps the most. )   Over the last 2 weeks decided to start working out, eating healthier. Works out with neighbor in her garage - dead lifts, chest flys, biceps/triceps, lap pull downs, abs. Over the past week, noticing constant 7/10 pain behind R shoulderblade with some intermittent radiation of pain below axilla. Yesterday ate green beans, potatoes, green juice. Pain feels better with heat.   Has been staying well hydrated with gatorade.   When she eats greasy foods she notes she gets nauseated, treated at home with prilosec OTC with benefit.   No fevers/chills, abd pain, n/v/d/c, urinary sxs.      Relevant past medical, surgical, family and social history reviewed and updated as indicated. Interim medical history since our last visit reviewed. Allergies and medications reviewed and updated. Outpatient Medications Prior to Visit  Medication Sig Dispense Refill  . levothyroxine (SYNTHROID, LEVOTHROID) 125 MCG tablet Take 1 tablet by mouth every morning on an empty stomach with a full glass of water. No food or other medications for 30 minutes. 90 tablet 1  . valACYclovir (VALTREX) 500 MG tablet TAKE 1 TO 2 TABLETS BY  MOUTH ONCE DAILY FOR  OUTBREAK PREVENTION NEED APPOINTMENT FOR ANY MORE REFILLS 180 tablet 0   No facility-administered medications prior to visit.      Per HPI unless specifically indicated in ROS section below Review of Systems  Objective:    BP 124/78 (BP Location: Left Arm, Patient Position: Sitting, Cuff Size: Large)   Pulse 71   Temp 97.7 F (36.5 C) (Temporal)   Ht 5\' 8"  (1.727 m)   Wt 234 lb (106.1 kg)   SpO2 97%   BMI 35.58 kg/m   Wt Readings from Last 3 Encounters:  02/02/19 234 lb (106.1 kg)  01/09/19 231 lb (104.8 kg)  08/27/18 231 lb 8 oz (105 kg)    Physical Exam Vitals signs and nursing note reviewed.  Constitutional:      Appearance: Normal appearance. She is not ill-appearing.  HENT:     Mouth/Throat:     Mouth: Mucous membranes are moist.     Pharynx: No posterior oropharyngeal erythema.  Eyes:     Extraocular Movements: Extraocular movements intact.     Pupils: Pupils are equal, round, and reactive to light.  Cardiovascular:     Rate and Rhythm: Normal rate and regular rhythm.     Pulses: Normal pulses.     Heart sounds: Normal heart sounds. No murmur.  Pulmonary:     Effort: Pulmonary effort is normal. No respiratory distress.     Breath sounds: Normal breath sounds. No wheezing, rhonchi or rales.  Musculoskeletal: Normal range of motion.     Comments:  FROM at shoulders without pain  No midline cervical or thoracic spine pain  Reproducible tenderness with palpation of R rhomboids  Neurological:     Mental Status: She is alert.       Results for orders placed or performed in visit on 02/28/18  Comprehensive metabolic panel  Result Value Ref Range   Glucose 90 65 - 99 mg/dL   BUN 22 6 - 24 mg/dL   Creatinine, Ser 0.84 0.57 - 1.00 mg/dL   GFR calc non Af Amer 78 >59 mL/min/1.73   GFR calc Af Amer 90 >59 mL/min/1.73   BUN/Creatinine Ratio 26 (H) 9 - 23   Sodium 145 (H) 134 - 144 mmol/L   Potassium 4.7 3.5 - 5.2 mmol/L   Chloride 107 (H) 96 - 106 mmol/L   CO2 23 20 - 29 mmol/L   Calcium 9.5 8.7 - 10.2 mg/dL   Total Protein 6.8 6.0 - 8.5 g/dL   Albumin 4.4 3.5 - 5.5 g/dL   Globulin, Total 2.4 1.5 - 4.5 g/dL   Albumin/Globulin Ratio 1.8 1.2 - 2.2   Bilirubin Total  0.3 0.0 - 1.2 mg/dL   Alkaline Phosphatase 72 39 - 117 IU/L   AST 17 0 - 40 IU/L   ALT 17 0 - 32 IU/L  TSH  Result Value Ref Range   TSH 1.690 0.450 - 4.500 uIU/mL   Assessment & Plan:   Problem List Items Addressed This Visit    Rhomboid muscle strain, initial encounter - Primary    Anticipate R rhomboid strain after starting new workout routine. Supportive care reviewed as per instructions - heating pad, tylenol, robaxin, stretching exercises from [facility] pt advisor. Update if not improving with treatment. Pt agrees with plan. Discussed sedation precautions with muscle relaxant.           Meds ordered this encounter  Medications  . methocarbamol (ROBAXIN) 500 MG tablet    Sig: Take 1-2 tablets (500-1,000 mg total) by mouth 2 (two) times daily as needed for muscle spasms.    Dispense:  30 tablet    Refill:  0   No orders of the defined types were placed in this encounter.   Patient Instructions  I think you have right rhomboid strain. Continue heating pad. Do exercises provided today.  Use tylenol 500mg  1-2 tablets twice daily as needed for pain Try muscle relaxant robaxin sent to pharmacy 500mg  1-2 tablets twice daily as needed. Caution this can make you sleepy so don't take and drive.  Let us know if not improving with this.    Follow up plan: Return if symptoms worsen or fail to improve.  Ria Bush, MD

## 2019-02-02 NOTE — Patient Instructions (Signed)
I think you have right rhomboid strain. Continue heating pad. Do exercises provided today.  Use tylenol 500mg  1-2 tablets twice daily as needed for pain Try muscle relaxant robaxin sent to pharmacy 500mg  1-2 tablets twice daily as needed. Caution this can make you sleepy so don't take and drive.  Let us know if not improving with this.

## 2019-02-19 ENCOUNTER — Ambulatory Visit: Payer: BC Managed Care – PPO | Admitting: Internal Medicine

## 2019-02-19 ENCOUNTER — Telehealth: Payer: Self-pay | Admitting: Primary Care

## 2019-02-19 ENCOUNTER — Ambulatory Visit (INDEPENDENT_AMBULATORY_CARE_PROVIDER_SITE_OTHER)
Admission: RE | Admit: 2019-02-19 | Discharge: 2019-02-19 | Disposition: A | Discharge status: Home / Self Care | Payer: BC Managed Care – PPO | Source: Ambulatory Visit | Attending: Internal Medicine | Admitting: Internal Medicine

## 2019-02-19 ENCOUNTER — Other Ambulatory Visit: Payer: Self-pay | Admitting: Primary Care

## 2019-02-19 ENCOUNTER — Other Ambulatory Visit: Payer: Self-pay

## 2019-02-19 ENCOUNTER — Encounter: Payer: Self-pay | Admitting: Internal Medicine

## 2019-02-19 VITALS — BP 116/68 | HR 76 | Temp 98.4°F | Resp 16 | Ht 68.0 in | Wt 236.0 lb

## 2019-02-19 DIAGNOSIS — B009 Herpesviral infection, unspecified: Secondary | ICD-10-CM

## 2019-02-19 DIAGNOSIS — M25531 Pain in right wrist: Secondary | ICD-10-CM

## 2019-02-19 MED ORDER — PREDNISONE 10 MG PO TABS: ORAL_TABLET | ORAL | 0 refills | Status: DC

## 2019-02-19 NOTE — Patient Instructions (Signed)
Wrist Pain, Adult There are many things that can cause wrist pain. Some common causes include:  An injury to the wrist area.  Overuse of the joint.  A condition that causes too much pressure to be put on a nerve in the wrist (carpal tunnel syndrome).  Wear and tear of the joints that happens as a person gets older (osteoarthritis).  Other types of arthritis. Sometimes, the cause of wrist pain is not known. Often, the pain goes away when you follow your doctor's instructions for helping pain at home, such as resting or icing your wrist. If your wrist pain does not go away, it is important to tell your doctor. Follow these instructions at home:  Rest the wrist area for 48 hours or more, or as long as told by your doctor.  If a splint or elastic bandage has been put on your wrist, use it as told by your doctor. ? Take off the splint or bandage only as told by your doctor. ? Loosen the splint or bandage if your fingers tingle, lose feeling (get numb), or turn cold or blue.  If directed, apply ice to the injured area: ? If you have a removable splint or elastic bandage, remove it as told by your doctor. ? Put ice in a plastic bag. ? Place a towel between your skin and the bag or between your splint or bandage and the bag. ? Leave the ice on for 20 minutes, 2-3 times a day.   Keep your arm raised (elevated) above the level of your heart while you are sitting or lying down.  Take over-the-counter and prescription medicines only as told by your doctor.  Keep all follow-up visits as told by your doctor. This is important. Contact a doctor if:  You have a sudden sharp pain in the wrist, hand, or arm that is different or new.  The swelling or bruising on your wrist or hand gets worse.  Your skin becomes red, gets a rash, or has open sores.  Your pain does not get better or it gets worse. Get help right away if:  You lose feeling in your fingers or hand.  Your fingers turn white,  very red, or cold and blue.  You cannot move your fingers.  You have a fever or chills. This information is not intended to replace advice given to you by your health care provider. Make sure you discuss any questions you have with your health care provider. Document Released: 01/09/2008 Document Revised: 07/05/2017 Document Reviewed: 02/09/2016 Elsevier Patient Education  2020 Elsevier Inc.  

## 2019-02-19 NOTE — Telephone Encounter (Signed)
Message left for patient to return my call.  

## 2019-02-19 NOTE — Progress Notes (Signed)
Subjective:    Patient ID: Lori Crawford, female    DOB: 1960-10-11, 58 y.o.   MRN: [id number]  HPI  Patient presents to the clinic today with c/o right wrist pain.  This started approximately 4 weeks ago after opening crab legs doing repetitive movement.  She is right handed.  She describes the pain as clicking and aching which radiates down her pinky finger and worsens with outward and inward rotation.  She denies any specific injury to her wrist but has broken her right hand previously which needed surgical intervention.  She has tried Motrin and muscle relaxers with minimal relief.  Review of Systems      Past Medical History:  Diagnosis Date  . Allergy    Seasonal   . Arthritis   . Chicken pox   . History of frequent urinary tract infections   . Hyperlipidemia   . Sinus headache   . Thyroid disease     Current Outpatient Medications  Medication Sig Dispense Refill  . levothyroxine (SYNTHROID, LEVOTHROID) 125 MCG tablet Take 1 tablet by mouth every morning on an empty stomach with a full glass of water. No food or other medications for 30 minutes. 90 tablet 1  . methocarbamol (ROBAXIN) 500 MG tablet Take 1-2 tablets (500-1,000 mg total) by mouth 2 (two) times daily as needed for muscle spasms. 30 tablet 0  . valACYclovir (VALTREX) 500 MG tablet TAKE 1 TO 2 TABLETS BY  MOUTH ONCE DAILY FOR  OUTBREAK PREVENTION NEED APPOINTMENT FOR ANY MORE REFILLS 180 tablet 0   No current facility-administered medications for this visit.     No Known Allergies  Family History  Adopted: Yes    Social History   Socioeconomic History  . Marital status: Single    Spouse name: Not on file  . Number of children: Not on file  . Years of education: Not on file  . Highest education level: Not on file  Occupational History  . Not on file  Social Needs  . Financial resource strain: Not on file  . Food insecurity    Worry: Not on file    Inability: Not on file  . Transportation needs     Medical: Not on file    Non-medical: Not on file  Tobacco Use  . Smoking status: Never Smoker  . Smokeless tobacco: Never Used  Substance and Sexual Activity  . Alcohol use: Yes  . Drug use: No  . Sexual activity: Not on file  Lifestyle  . Physical activity    Days per week: Not on file    Minutes per session: Not on file  . Stress: Not on file  Relationships  . Social Herbalist on phone: Not on file    Gets together: Not on file    Attends religious service: Not on file    Active member of club or organization: Not on file    Attends meetings of clubs or organizations: Not on file    Relationship status: Not on file  . Intimate partner violence    Fear of current or ex partner: Not on file    Emotionally abused: Not on file    Physically abused: Not on file    Forced sexual activity: Not on file  Other Topics Concern  . Not on file  Social History Narrative  . Not on file     Constitutional: Denies fever, malaise, fatigue, headache or abrupt weight changes.  Musculoskeletal: Complains of wrist  pain with decreased ROM and pain during palpation.  Denies difficulty with gait, muscle pain, or swelling.  Skin: Denies redness, rashes, lesions or ulcercations.     No other specific complaints in a complete review of systems (except as listed in HPI above).  Objective:   Physical Exam  BP 116/68   Pulse 76   Temp 98.4 F (36.9 C) (Temporal)   Resp 16   Ht 5\' 8"  (1.727 m)   Wt 236 lb (107 kg)   SpO2 97%   BMI 35.88 kg/m    Wt Readings from Last 3 Encounters:  02/02/19 234 lb (106.1 kg)  01/09/19 231 lb (104.8 kg)  08/27/18 231 lb 8 oz (105 kg)    General: Appears her stated age, well developed, well nourished in NAD. Skin: Warm, dry and intact. No rashes, lesions or ulcerations noted. Musculoskeletal: Decreased ROM of right wrist with internal and external rotation and tenderness to palpation just under the ulnar styloid. Hand grips equal.    BMET    Component Value Date/Time   NA 145 (H) 02/28/2018 0847   K 4.7 02/28/2018 0847   CL 107 (H) 02/28/2018 0847   CO2 23 02/28/2018 0847   GLUCOSE 90 02/28/2018 0847   GLUCOSE 95 08/08/2016 1004   BUN 22 02/28/2018 0847   CREATININE 0.84 02/28/2018 0847   CALCIUM 9.5 02/28/2018 0847   GFRNONAA 78 02/28/2018 0847   GFRAA 90 02/28/2018 0847    Lipid Panel  No results found for: CHOL, TRIG, HDL, CHOLHDL, VLDL, LDLCALC  CBC No results found for: WBC, RBC, HGB, HCT, PLT, MCV, MCH, MCHC, RDW, LYMPHSABS, MONOABS, EOSABS, BASOSABS  Hgb A1C No results found for: HGBA1C          Assessment & Plan:   Right Wrist Pain:  Xray right wrist today ? Tendonitis RX for Pred Taper x 6 days, avoid OTC NSAID's Discussed immobilization x 1 week to prevent overuse Ice may be helpful  Will follow up after xray, return precautions discussed Nicki Reaperegina Lachelle Rissler, NP

## 2019-02-19 NOTE — Telephone Encounter (Signed)
Please kindly notify patient that she is overdue for a follow-up visit with me including labs. We can do this virtually if she prefers given her recent evaluation with Golden Hurter in the office.  She will need labs ahead of time.  Please schedule.  Is she out of her levothyroxine?  If so how long she been out?  If not then how many does she have remaining?

## 2019-02-19 NOTE — Telephone Encounter (Signed)
-----   Message from Sanjuana Letters, [place] sent at 02/19/2019 12:09 PM EDT ----- Regarding: Rx Refills request Pt was seen today with Lori Crawford.  for wrist pain. Pt requested Rx refills and to have them sent to [facility] mail order. Please advise, Thanks .

## 2019-02-23 NOTE — Telephone Encounter (Signed)
Per DPR, left detail message of Kate Clark's comments for patient to call back 

## 2019-02-27 MED ORDER — VALACYCLOVIR HCL 500 MG PO TABS: ORAL_TABLET | ORAL | 1 refills | Status: DC

## 2019-02-27 NOTE — Telephone Encounter (Signed)
Noted.  Refill sent to pharmacy. 

## 2019-02-27 NOTE — Telephone Encounter (Signed)
Spoken and notified patient of Lori Millers comments. Patient verbalized understanding.  Yes, patient takes 2 tablet daily, please send to [facility]

## 2019-02-27 NOTE — Telephone Encounter (Signed)
No follow up appointment needed, my apologies! Just labs only. They are already ordered, double check to make sure they are [facility] and release.  Please also clarify that she is still taking 1000 mg of valtrex (2, 500 mg tablets), correct? Thanks!

## 2019-02-27 NOTE — Telephone Encounter (Signed)
Patient called back. She stated that she will go the [facility] and get her lab done next week. Patient stated that is there reason why she needed another appointment since she has a CPE on 01/09/2019. However, patient understands she needs her lab done.  Also patient stated that she has enough levothyroxine but needed a refill of valacyclovir.

## 2019-03-24 DIAGNOSIS — Z1159 Encounter for screening for other viral diseases: Secondary | ICD-10-CM | POA: Diagnosis not present

## 2019-03-24 DIAGNOSIS — E890 Postprocedural hypothyroidism: Secondary | ICD-10-CM | POA: Diagnosis not present

## 2019-03-24 DIAGNOSIS — Z0000 Encounter for general adult medical examination without abnormal findings: Secondary | ICD-10-CM | POA: Diagnosis not present

## 2019-03-25 LAB — COMPREHENSIVE METABOLIC PANEL
ALT: 12 IU/L (ref 0–32)
AST: 13 IU/L (ref 0–40)
Albumin/Globulin Ratio: 1.6 (ref 1.2–2.2)
Albumin: 4.2 g/dL (ref 3.8–4.9)
Alkaline Phosphatase: 76 IU/L (ref 39–117)
BUN/Creatinine Ratio: 18 (ref 9–23)
BUN: 16 mg/dL (ref 6–24)
Bilirubin Total: 0.2 mg/dL (ref 0.0–1.2)
CO2: 26 mmol/L (ref 20–29)
Calcium: 9.3 mg/dL (ref 8.7–10.2)
Chloride: 104 mmol/L (ref 96–106)
Creatinine, Ser: 0.88 mg/dL (ref 0.57–1.00)
GFR calc Af Amer: 85 mL/min/{1.73_m2} (ref >59)
GFR calc non Af Amer: 74 mL/min/{1.73_m2} (ref >59)
Globulin, Total: 2.6 g/dL (ref 1.5–4.5)
Glucose: 99 mg/dL (ref 65–99)
Potassium: 4.7 mmol/L (ref 3.5–5.2)
Sodium: 141 mmol/L (ref 134–144)
Total Protein: 6.8 g/dL (ref 6.0–8.5)

## 2019-03-25 LAB — LIPID PANEL
Chol/HDL Ratio: 2.9 ratio (ref 0.0–4.4)
Cholesterol, Total: 252 mg/dL — ABNORMAL HIGH (ref 100–199)
HDL: 86 mg/dL (ref >39)
LDL Calculated: 148 mg/dL — ABNORMAL HIGH (ref 0–99)
Triglycerides: 92 mg/dL (ref 0–149)
VLDL Cholesterol Cal: 18 mg/dL (ref 5–40)

## 2019-03-25 LAB — HEMOGLOBIN A1C
Est. average glucose Bld gHb Est-mCnc: 117 mg/dL
Hgb A1c MFr Bld: 5.7 % — ABNORMAL HIGH (ref 4.8–5.6)

## 2019-03-25 LAB — HEPATITIS C ANTIBODY: Hep C Virus Ab: 0.1 s/co ratio (ref 0.0–0.9)

## 2019-03-25 LAB — TSH: TSH: 2.55 u[IU]/mL (ref 0.450–4.500)

## 2019-03-30 ENCOUNTER — Other Ambulatory Visit: Payer: Self-pay | Admitting: Primary Care

## 2019-03-30 DIAGNOSIS — E039 Hypothyroidism, unspecified: Secondary | ICD-10-CM

## 2019-03-30 DIAGNOSIS — Z0000 Encounter for general adult medical examination without abnormal findings: Secondary | ICD-10-CM

## 2019-06-12 ENCOUNTER — Other Ambulatory Visit: Payer: Self-pay | Admitting: Primary Care

## 2019-06-12 DIAGNOSIS — E039 Hypothyroidism, unspecified: Secondary | ICD-10-CM

## 2019-06-12 DIAGNOSIS — B009 Herpesviral infection, unspecified: Secondary | ICD-10-CM

## 2019-06-12 MED ORDER — VALACYCLOVIR HCL 500 MG PO TABS: ORAL_TABLET | ORAL | 2 refills | Status: DC

## 2019-06-12 NOTE — Telephone Encounter (Signed)
Received refill request from [place]. Called patient to make sure this was requested. Patient states that insurance told her she can not use [facility] for her routine medications and has to use [facility] but that is not convenient for her due to location. Patient would like to use [facility] for her medication now.  Please review Valtrex refill request.

## 2019-06-12 NOTE — Telephone Encounter (Signed)
Noted, Rx sent to [facility].

## 2019-07-01 DIAGNOSIS — Z713 Dietary counseling and surveillance: Secondary | ICD-10-CM | POA: Diagnosis not present

## 2019-07-06 ENCOUNTER — Ambulatory Visit: Payer: BC Managed Care – PPO | Admitting: Family Medicine

## 2019-07-06 ENCOUNTER — Other Ambulatory Visit: Payer: Self-pay

## 2019-07-06 ENCOUNTER — Encounter: Payer: Self-pay | Admitting: Family Medicine

## 2019-07-06 DIAGNOSIS — H00019 Hordeolum externum unspecified eye, unspecified eyelid: Secondary | ICD-10-CM | POA: Insufficient documentation

## 2019-07-06 HISTORY — DX: Hordeolum externum unspecified eye, unspecified eyelid: H00.019

## 2019-07-06 MED ORDER — NEOMYCIN-BACITRACIN ZN-POLYMYX 5-400-10000 OP OINT: 1.0000 "application " | TOPICAL_OINTMENT | Freq: Three times a day (TID) | OPHTHALMIC | 0 refills | Status: DC

## 2019-07-06 NOTE — Assessment & Plan Note (Signed)
Bilateral upper inner eyelids. Not responding to home remedy of neosporin ointment and warm compresses. Will add neomycin/bacitracin/polyyixin drops and update if not improving over next 2-3 days for ophthalmology eval as may need I&D. Pt agrees with plan.

## 2019-07-06 NOTE — Progress Notes (Signed)
This visit was conducted in person.  BP 120/78 (BP Location: Left Arm, Patient Position: Sitting, Cuff Size: Large)   Pulse 70   Temp 97.8 F (36.6 C) (Temporal)   Ht 5\' 8"  (1.727 m)   Wt 230 lb 1 oz (104.4 kg)   SpO2 98%   BMI 34.98 kg/m    CC: ?eye infection Subjective:    Patient ID: Lori Crawford, female    DOB: Jul 16, 1961, 58 y.o.   MRN: [id number]  HPI: Lori Crawford is a 58 y.o. female presenting on 07/06/2019 for Eye Problem (C/o bilateral eye itching, burning, swelling and pain. Started about 1 wk ago on right eye.  Pt popped a small pimple in right eyelid, which drained.  Has applied Neosporin, helpful with pain. )   1 wk h/o stye to upper R eyelid treating with warm compresses as well as neosporin ointment. Now L eyelid swelling/discomfort concern for stye developing. Some itchy watery eyes. Some eye matting/discharge when she wakes up. No redness/pink of conjunctiva.   + allergic rhinitis worse in spring and fall - manifest as itchy eyes, sneezing. She manages this with zyrtec.      Relevant past medical, surgical, family and social history reviewed and updated as indicated. Interim medical history since our last visit reviewed. Allergies and medications reviewed and updated. Outpatient Medications Prior to Visit  Medication Sig Dispense Refill  . levothyroxine (SYNTHROID) 125 MCG tablet TAKE 1 TABLET BY MOUTH  EVERY MORNING ON AN EMPTY  STOMACH WITH A FULL GLASS  OF WATER. NO FOOD OR OTHER  MEDICATIONS FOR 30 MINUTES. 90 tablet 2  . valACYclovir (VALTREX) 500 MG tablet TAKE 1 TO 2 TABLETS BY  MOUTH ONCE DAILY FOR  OUTBREAK PREVENTION 180 tablet 2  . methocarbamol (ROBAXIN) 500 MG tablet Take 1-2 tablets (500-1,000 mg total) by mouth 2 (two) times daily as needed for muscle spasms. 30 tablet 0  . predniSONE (DELTASONE) 10 MG tablet Take 3 tabs on days 1-2, take 2 tabs on days 3-4, take 1 tab on days 5-6 12 tablet 0   No facility-administered medications prior to  visit.      Per HPI unless specifically indicated in ROS section below Review of Systems Objective:    BP 120/78 (BP Location: Left Arm, Patient Position: Sitting, Cuff Size: Large)   Pulse 70   Temp 97.8 F (36.6 C) (Temporal)   Ht 5\' 8"  (1.727 m)   Wt 230 lb 1 oz (104.4 kg)   SpO2 98%   BMI 34.98 kg/m   Wt Readings from Last 3 Encounters:  07/06/19 230 lb 1 oz (104.4 kg)  02/19/19 236 lb (107 kg)  02/02/19 234 lb (106.1 kg)    Physical Exam Vitals signs and nursing note reviewed.  Constitutional:      General: She is not in acute distress.    Appearance: Normal appearance. She is not ill-appearing.  Eyes:     General:        Right eye: Hordeolum present. No discharge.        Left eye: Hordeolum present.No discharge.     Extraocular Movements: Extraocular movements intact.     Right eye: Normal extraocular motion.     Left eye: Normal extraocular motion.     Conjunctiva/sclera: Conjunctivae normal.     Right eye: Right conjunctiva is not injected.     Left eye: Left conjunctiva is not injected.     Comments: R>L edematous erythematous upper eyelids with inner stye  Neurological:     Mental Status: She is alert.       Results for orders placed or performed in visit on 01/09/19  Hepatitis C antibody  Result Value Ref Range   Hep C Virus Ab 0.1 0.0 - 0.9 s/co ratio   Assessment & Plan:  This visit occurred during the SARS-CoV-2 public health emergency.  Safety protocols were in place, including screening questions prior to the visit, additional usage of staff PPE, and extensive cleaning of exam room while observing appropriate contact time as indicated for disinfecting solutions.   Problem List Items Addressed This Visit    Stye    Bilateral upper inner eyelids. Not responding to home remedy of neosporin ointment and warm compresses. Will add neomycin/bacitracin/polyyixin drops and update if not improving over next 2-3 days for ophthalmology eval as may need I&D. Pt  agrees with plan.           Meds ordered this encounter  Medications  . neomycin-bacitracin-polymyxin (NEOSPORIN) ophthalmic ointment    Sig: Place 1 application into both eyes 3 (three) times daily.    Dispense:  3.5 g    Refill:  0   No orders of the defined types were placed in this encounter.   Follow up plan: No follow-ups on file.  Eustaquio Boyden, MD

## 2019-07-06 NOTE — Patient Instructions (Signed)
You do have styes bilaterally Continue warm compresses Use new antibiotic drops sent to pharmacy. If no better in next few days, let us know for referral to eye doctor.   Stye  A stye, also known as a hordeolum, is a bump that forms on an eyelid. It may look like a pimple next to the eyelash. A stye can form inside the eyelid (internal stye) or outside the eyelid (external stye). A stye can cause redness, swelling, and pain on the eyelid. Styes are very common. Anyone can get them at any age. They usually occur in just one eye, but you may have more than one in either eye. What are the causes? A stye is caused by an infection. The infection is almost always caused by bacteria called Staphylococcus aureus. This is a common type of bacteria that lives on the skin. An internal stye may result from an infected oil-producing gland inside the eyelid. An external stye may be caused by an infection at the base of the eyelash (hair follicle). What increases the risk? You are more likely to develop a stye if:  You have had a stye before.  You have any of these conditions: ? Diabetes. ? Red, itchy, inflamed eyelids (blepharitis). ? A skin condition such as seborrheic dermatitis or rosacea. ? High fat levels in your blood (lipids). What are the signs or symptoms? The most common symptom of a stye is eyelid pain. Internal styes are more painful than external styes. Other symptoms may include:  Painful swelling of your eyelid.  A scratchy feeling in your eye.  Tearing and redness of your eye.  Pus draining from the stye. How is this diagnosed? Your health care provider may be able to diagnose a stye just by examining your eye. The health care provider may also check to make sure:  You do not have a fever or other signs of a more serious infection.  The infection has not spread to other parts of your eye or areas around your eye. How is this treated? Most styes will clear up in a few days  without treatment or with warm compresses applied to the area. You may need to use antibiotic drops or ointment to treat an infection. In some cases, if your stye does not heal with routine treatment, your health care provider may drain pus from the stye using a thin blade or needle. This may be done if the stye is large, causing a lot of pain, or affecting your vision. Follow these instructions at home:  Take over-the-counter and prescription medicines only as told by your health care provider. This includes eye drops or ointments.  If you were prescribed an antibiotic medicine, apply or use it as told by your health care provider. Do not stop using the antibiotic even if your condition improves.  Apply a warm, wet cloth (warm compress) to your eye for 5-10 minutes, 4 times a day.  Clean the affected eyelid as directed by your health care provider.  Do not wear contact lenses or eye makeup until your stye has healed.  Do not try to pop or drain the stye.  Do not rub your eye. Contact a health care provider if:  You have chills or a fever.  Your stye does not go away after several days.  Your stye affects your vision.  Your eyeball becomes swollen, red, or painful. Get help right away if:  You have pain when moving your eye around. Summary  A stye is  a bump that forms on an eyelid. It may look like a pimple next to the eyelash.  A stye can form inside the eyelid (internal stye) or outside the eyelid (external stye). A stye can cause redness, swelling, and pain on the eyelid.  Your health care provider may be able to diagnose a stye just by examining your eye.  Apply a warm, wet cloth (warm compress) to your eye for 5-10 minutes, 4 times a day. This information is not intended to replace advice given to you by your health care provider. Make sure you discuss any questions you have with your health care provider. Document Released: 05/02/2005 Document Revised: 07/05/2017 Document  Reviewed: 04/04/2017 Elsevier Patient Education  2020 [employer].

## 2019-07-10 DIAGNOSIS — H0014 Chalazion left upper eyelid: Secondary | ICD-10-CM | POA: Diagnosis not present

## 2019-07-10 DIAGNOSIS — H00021 Hordeolum internum right upper eyelid: Secondary | ICD-10-CM | POA: Diagnosis not present

## 2019-10-10 ENCOUNTER — Ambulatory Visit: Payer: Self-pay | Attending: Internal Medicine

## 2019-10-10 DIAGNOSIS — Z23 Encounter for immunization: Secondary | ICD-10-CM | POA: Insufficient documentation

## 2019-10-10 NOTE — Progress Notes (Signed)
   Covid-19 Vaccination Clinic  Name:  Lori Crawford    MRN: [id number] DOB: 1961/06/25  10/10/2019  [name] was observed post Covid-19 immunization for 15 minutes without incident. She was provided with Vaccine Information Sheet and instruction to access the V-Safe system.   [name] was instructed to call 911 with any severe reactions post vaccine: Marland Kitchen Difficulty breathing  . Swelling of face and throat  . A fast heartbeat  . A bad rash all over body  . Dizziness and weakness   Immunizations Administered    Name Date Dose VIS Date Route   Moderna COVID-19 Vaccine 10/10/2019  3:45 PM 0.5 mL 07/07/2019 Intramuscular   Manufacturer: Moderna   Lot: [id number]   NDC: 14481-856-31

## 2019-11-07 ENCOUNTER — Ambulatory Visit: Payer: Self-pay | Attending: Internal Medicine

## 2019-11-07 DIAGNOSIS — Z23 Encounter for immunization: Secondary | ICD-10-CM

## 2019-11-07 NOTE — Progress Notes (Signed)
   Covid-19 Vaccination Clinic  Name:  Kimari Lienhard    MRN: [id number] DOB: July 13, 1961  11/07/2019  Ms. Colan was observed post Covid-19 immunization for 15 minutes without incident. She was provided with Vaccine Information Sheet and instruction to access the V-Safe system.   Ms. Hennings was instructed to call 911 with any severe reactions post vaccine: Marland Kitchen Difficulty breathing  . Swelling of face and throat  . A fast heartbeat  . A bad rash all over body  . Dizziness and weakness   Immunizations Administered    Name Date Dose VIS Date Route   Moderna COVID-19 Vaccine 11/07/2019  9:11 AM 0.5 mL 07/07/2019 Intramuscular   Manufacturer: Moderna   Lot: [id number]   NDC: 62263-335-45

## 2020-01-29 ENCOUNTER — Other Ambulatory Visit: Payer: Self-pay | Admitting: Primary Care

## 2020-01-29 DIAGNOSIS — B009 Herpesviral infection, unspecified: Secondary | ICD-10-CM

## 2020-03-10 ENCOUNTER — Other Ambulatory Visit: Payer: Self-pay | Admitting: Primary Care

## 2020-03-10 DIAGNOSIS — E039 Hypothyroidism, unspecified: Secondary | ICD-10-CM

## 2020-03-26 ENCOUNTER — Other Ambulatory Visit: Payer: Self-pay | Admitting: Primary Care

## 2020-03-26 DIAGNOSIS — E039 Hypothyroidism, unspecified: Secondary | ICD-10-CM

## 2020-04-13 ENCOUNTER — Other Ambulatory Visit: Payer: Self-pay | Admitting: Primary Care

## 2020-04-13 DIAGNOSIS — E039 Hypothyroidism, unspecified: Secondary | ICD-10-CM

## 2020-09-22 ENCOUNTER — Other Ambulatory Visit: Payer: Self-pay

## 2020-09-22 ENCOUNTER — Telehealth (INDEPENDENT_AMBULATORY_CARE_PROVIDER_SITE_OTHER): Payer: Self-pay | Admitting: Primary Care

## 2020-09-22 ENCOUNTER — Encounter: Payer: Self-pay | Admitting: Primary Care

## 2020-09-22 VITALS — Ht 68.0 in | Wt 230.0 lb

## 2020-09-22 DIAGNOSIS — R3589 Other polyuria: Secondary | ICD-10-CM

## 2020-09-22 DIAGNOSIS — R3915 Urgency of urination: Secondary | ICD-10-CM | POA: Insufficient documentation

## 2020-09-22 LAB — POC URINALSYSI DIPSTICK (AUTOMATED)
Bilirubin, UA: NEGATIVE
Blood, UA: NEGATIVE
Glucose, UA: POSITIVE — AB
Ketones, UA: NEGATIVE
Leukocytes, UA: NEGATIVE
Nitrite, UA: NEGATIVE
Protein, UA: NEGATIVE
Spec Grav, UA: 1.015 (ref 1.010–1.025)
Urobilinogen, UA: 0.2 E.U./dL
pH, UA: 5 (ref 5.0–8.0)

## 2020-09-22 NOTE — Progress Notes (Signed)
Subjective:    Patient ID: Lori Crawford, female    DOB: 15-Dec-1960, 60 y.o.   MRN: [id number]  HPI  Virtual Visit via Video Note  I connected with Lori Crawford on 09/22/20 at  9:20 AM EST by a video enabled telemedicine application and verified that I am speaking with the correct person using two identifiers.  Location: Patient: Work Provider: Home Participants: Patient and myself   I discussed the limitations of evaluation and management by telemedicine and the availability of in person appointments. The patient expressed understanding and agreed to proceed.  History of Present Illness:  Lori Crawford is a 59 year old female with a history of hypothyroidism, chronic back pain who presents today with a chief complaint of urinary urgency.  She also reports mild pelvic pressure. She's been taking AZO and drinking cranberry juice with some improvement. Symptoms began three days ago, had protected intercourse two days before, did not urinate after. She drinks little water. Drinks 1-2 cups of caffeine daily, wine at night.   I have not seen her since June 2020. She is in between jobs right now, no health coverage, but plans on following back up with [place] when she's able.  She denies polyuria, polydipsia, vaginal discharge, vaginal itching.    Observations/Objective:  Alert and oriented. Appears well, not sickly. No distress. Speaking in complete sentences.   Assessment and Plan:  Three days of urinary urgency, no vaginal symptoms. Drinks little water, strongly advised she start.  UA today grossly negative, trace glucose. Culture sent.  She denies symptoms of diabetes. We will need to evaluate this when she's able to come into the office.   Await results. Limit caffeine, increase water.   Follow Up Instructions:  Ensure you are consuming 64 ounces of water daily.  Limit caffeine as this can cause irritation to the bladder.  I'll be in touch with urine culture results.    It was a pleasure to see you today! [name], NP-C    I discussed the assessment and treatment plan with the patient. The patient was provided an opportunity to ask questions and all were answered. The patient agreed with the plan and demonstrated an understanding of the instructions.   The patient was advised to call back or seek an in-person evaluation if the symptoms worsen or if the condition fails to improve as anticipated.    [name], NP    Review of Systems  Constitutional: Negative for fever.  Genitourinary: Positive for urgency. Negative for dysuria, frequency, hematuria and vaginal discharge.       Past Medical History:  Diagnosis Date  . Allergy    Seasonal   . Arthritis   . Chicken pox   . History of frequent urinary tract infections   . Hyperlipidemia   . Sinus headache   . Thyroid disease      Social History   Socioeconomic History  . Marital status: Single    Spouse name: Not on file  . Number of children: Not on file  . Years of education: Not on file  . Highest education level: Not on file  Occupational History  . Not on file  Tobacco Use  . Smoking status: Never Smoker  . Smokeless tobacco: Never Used  Substance and Sexual Activity  . Alcohol use: Yes  . Drug use: No  . Sexual activity: Not on file  Other Topics Concern  . Not on file  Social History Narrative  . Not on file  Social Determinants of Health   Financial Resource Strain: Not on file  Food Insecurity: Not on file  Transportation Needs: Not on file  Physical Activity: Not on file  Stress: Not on file  Social Connections: Not on file  Intimate Partner Violence: Not on file    Past Surgical History:  Procedure Laterality Date  . TONSILLECTOMY AND ADENOIDECTOMY      Family History  Adopted: Yes    No Known Allergies  Current Outpatient Medications on File Prior to Visit  Medication Sig Dispense Refill  . levothyroxine (SYNTHROID) 125 MCG tablet  TAKE 1 TABLET BY MOUTH IN  THE MORNING ON AN EMPTY  STOMACH WITH A FULL GLASS  OF WATER. NO FOOD OR OTHER  MEDICATIONS FOR 1/2 HOUR 30 tablet 0  . valACYclovir (VALTREX) 500 MG tablet TAKE 1 TO 2 TABLETS BY  MOUTH ONCE DAILY FOR  OUTBREAK PREVENTION 180 tablet 3   No current facility-administered medications on file prior to visit.    Ht 5\' 8"  (1.727 m)   Wt 230 lb (104.3 kg)   BMI 34.97 kg/m    Objective:   Physical Exam Constitutional:      General: She is not in acute distress. Pulmonary:     Effort: Pulmonary effort is normal.  Neurological:     Mental Status: She is alert and oriented to person, place, and time.  Psychiatric:        Mood and Affect: Mood normal.            Assessment & Plan:

## 2020-09-22 NOTE — Assessment & Plan Note (Signed)
Three days of urinary urgency, no vaginal symptoms. Drinks little water, strongly advised she start.  UA today grossly negative, trace glucose. Culture sent.  She denies symptoms of diabetes. We will need to evaluate this when she's able to come into the office.   Await results. Limit caffeine, increase water.

## 2020-09-22 NOTE — Patient Instructions (Signed)
Ensure you are consuming 64 ounces of water daily.  Limit caffeine as this can cause irritation to the bladder.  I'll be in touch with urine culture results.   It was a pleasure to see you today! [name], NP-C

## 2020-09-23 LAB — URINE CULTURE
MICRO NUMBER:: 11547317
SPECIMEN QUALITY:: ADEQUATE

## 2021-01-12 IMAGING — DX RIGHT WRIST - COMPLETE 3+ VIEW
4 series · 4 of 4 positions shown · non-contrast
Comparison: None.

CLINICAL DATA: Right wrist pain

EXAM:
RIGHT WRIST - COMPLETE 3+ VIEW

[wrist ap]
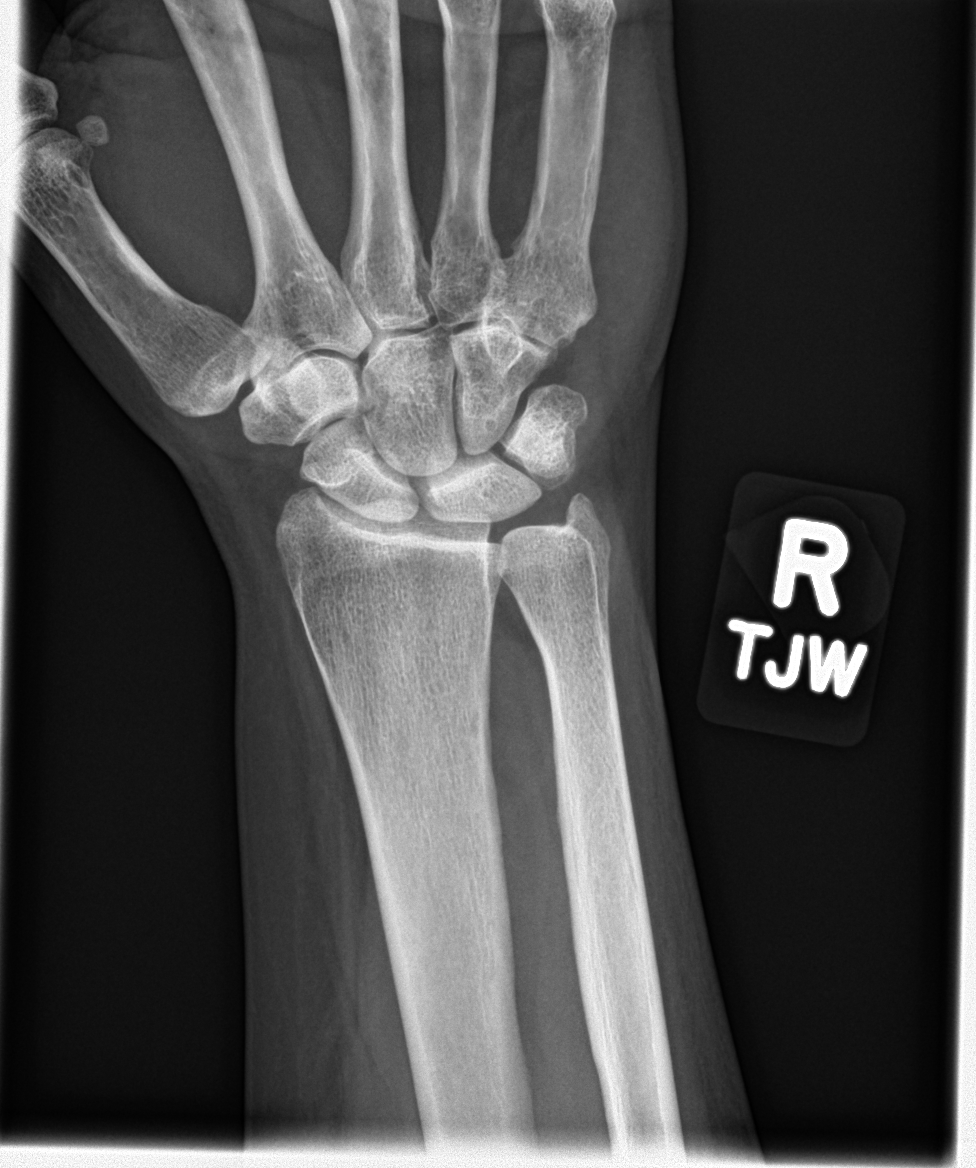

[wrist obl]
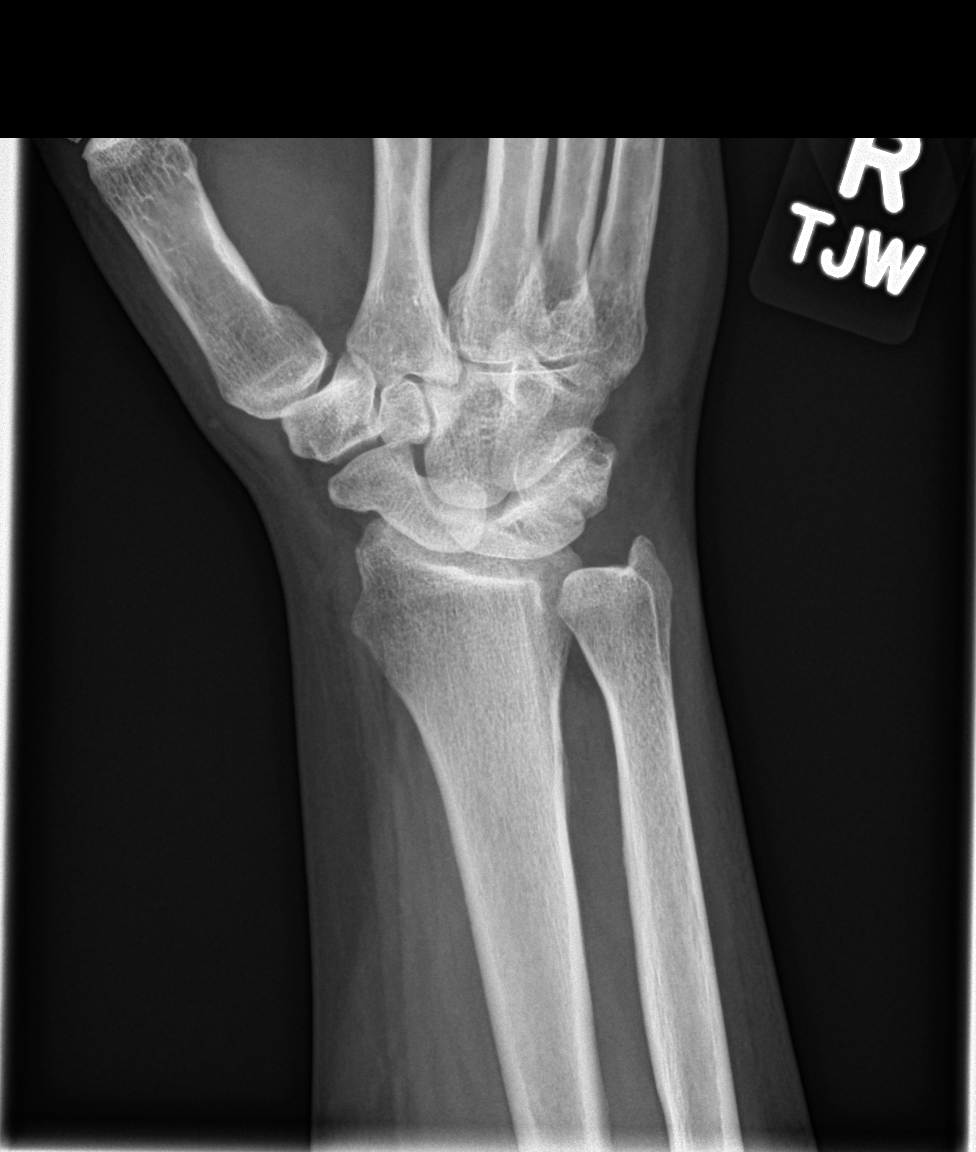

[wrist lat]
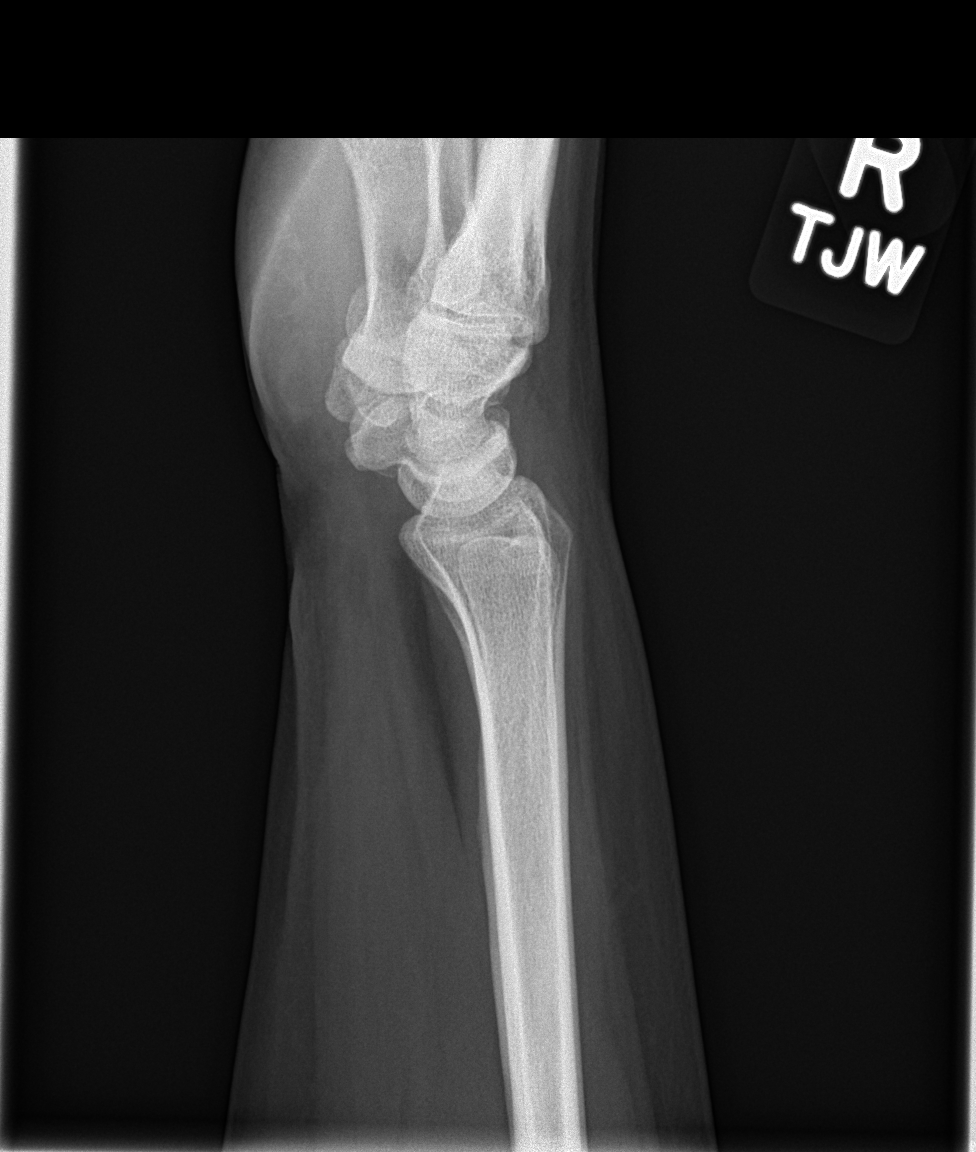

[wrist pa]
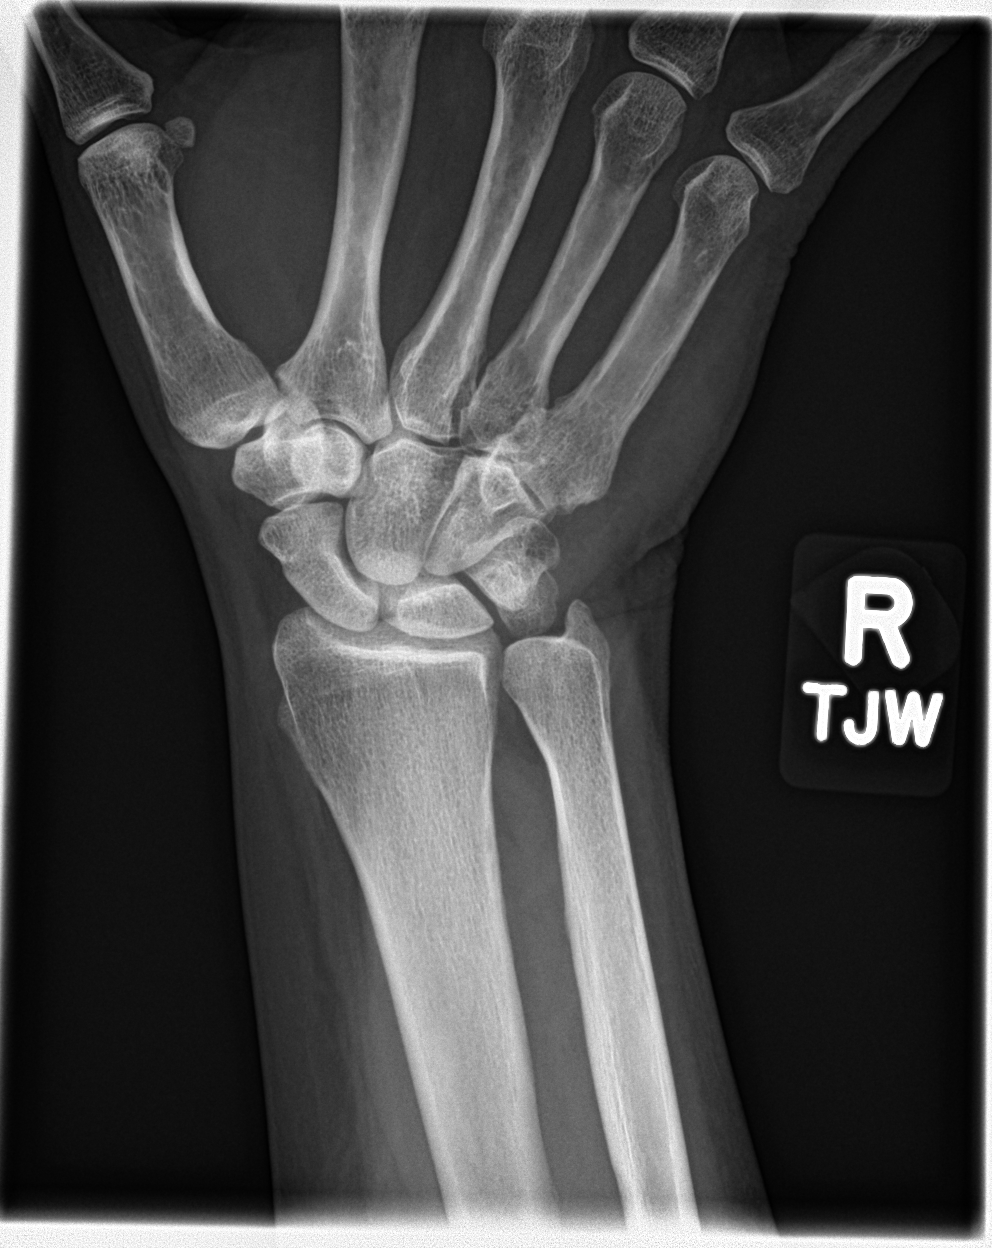

[4 of 4 positions shown; findings below may reference images not displayed]

FINDINGS: There is no evidence of fracture or dislocation. Few benign
intraosseous ganglion cyst within the hamate. Mild radiocarpal
arthrosis. No other focal bony abnormality. Soft tissues are
unremarkable.
IMPRESSION: Mild radiocarpal arthrosis. No acute osseous abnormality.

## 2021-02-28 ENCOUNTER — Telehealth: Payer: Self-pay

## 2021-02-28 NOTE — Telephone Encounter (Signed)
Please thank her for the update, glad that she is overall okay. Yes, it is common to test positive for COVID weeks to months after the infection.  She could try Flonase nasal spray for the sinus congestion, she can get this over-the-counter at any pharmacy.  Have her get the off brand called fluticasone.  1 spray in each nostril 2 times a day.

## 2021-02-28 NOTE — Telephone Encounter (Signed)
Patient called stating that she was exposed to someone with COVID on 02/16/21, symptoms started on 02/20/21-fever, congestion, tested positive for COVID on 02/20/21. She is doing overall ok except having sinus congestion feels to be behind her eyes ,sounds congested but her nasal passages are not stopped up feeling but they do burn. No more fever or any other symptoms.  Patient re tested herself today and she was still showing positive for COVID. Patient wonders if this is normal, will it take longer to show negative? Anything she should do for the sinuses or just to follow?  Patient did want to mention that she is between jobs right now and does not have insurance.

## 2021-03-01 NOTE — Telephone Encounter (Signed)
Pt called back as she received a call then the call dropped. I relayed Kate's message. She will try the fluticasone.

## 2021-03-06 NOTE — Telephone Encounter (Signed)
I spoke with pt; pt started with symptoms on 02/16/21  and had + covid on 02/16/21; [facility] and test was still positive.  Pt said still having a H/A that is getting better; pt having intermittent h/as on and off and no longer continuous pain;No H/A now but earlier today had H/A of 2 - 5. Pain level.  Pt has intermittent dry cough when air conditioning is on. Otherwise no cough. No other covid symptoms.pt has been quarantining at home since 02/17/21 except when double mask and goes to grocery store.Pt's employer is wanting pt to return to work. When is it safe for pt to return to work.  Pt also has genital herpes with no outbreak at this time and pt taking valacyclovir daily as prescribed and pt has been reading; pt wants to know if the covid could cause an outbreak of herpes and also does taking the valacyclovir affect the covid virus. Pt request cb after reviewed by Allayne Gitelman NP.

## 2021-03-07 NOTE — Telephone Encounter (Addendum)
According to the CDC guidelines patient may return to work five days after symptom onset if symptoms have significantly improved and she is not running fevers/taking fever reducing medication.   If symptoms began 02/16/21 then she may return to work if better and not running fevers. If she's still coughing then recommend masking when around others. If she needs letter then please provide for her to return effective immediately (provided she's doing a lot better).  The stress of Covid could have initiated an outbreak. Valtrex shouldn't have anything to do with Covid. Valtrex is an antiviral medication but does not work against Covid infection.

## 2021-03-08 NOTE — Telephone Encounter (Signed)
Called reviewed information with patient per her request I have sent letter in my chart to return back to work. She will call if any questions.

## 2021-08-25 ENCOUNTER — Ambulatory Visit: Payer: BC Managed Care – PPO | Admitting: Primary Care

## 2021-08-25 ENCOUNTER — Other Ambulatory Visit: Payer: Self-pay

## 2021-08-25 ENCOUNTER — Encounter: Payer: Self-pay | Admitting: Primary Care

## 2021-08-25 ENCOUNTER — Other Ambulatory Visit: Payer: Self-pay | Admitting: Primary Care

## 2021-08-25 VITALS — BP 124/76 | HR 69 | Temp 97.6°F | Ht 67.0 in | Wt 236.5 lb

## 2021-08-25 DIAGNOSIS — R7303 Prediabetes: Secondary | ICD-10-CM

## 2021-08-25 DIAGNOSIS — F419 Anxiety disorder, unspecified: Secondary | ICD-10-CM

## 2021-08-25 DIAGNOSIS — Z0000 Encounter for general adult medical examination without abnormal findings: Secondary | ICD-10-CM

## 2021-08-25 DIAGNOSIS — E890 Postprocedural hypothyroidism: Secondary | ICD-10-CM | POA: Diagnosis not present

## 2021-08-25 DIAGNOSIS — Z1211 Encounter for screening for malignant neoplasm of colon: Secondary | ICD-10-CM

## 2021-08-25 DIAGNOSIS — J3489 Other specified disorders of nose and nasal sinuses: Secondary | ICD-10-CM

## 2021-08-25 DIAGNOSIS — B009 Herpesviral infection, unspecified: Secondary | ICD-10-CM | POA: Diagnosis not present

## 2021-08-25 DIAGNOSIS — E039 Hypothyroidism, unspecified: Secondary | ICD-10-CM

## 2021-08-25 LAB — CBC
HCT: 42 % (ref 36.0–46.0)
Hemoglobin: 13.8 g/dL (ref 12.0–15.0)
MCHC: 32.8 g/dL (ref 30.0–36.0)
MCV: 97.7 fl (ref 78.0–100.0)
Platelets: 245 10*3/uL (ref 150.0–400.0)
RBC: 4.3 Mil/uL (ref 3.87–5.11)
RDW: 13.1 % (ref 11.5–15.5)
WBC: 5 10*3/uL (ref 4.0–10.5)

## 2021-08-25 LAB — COMPREHENSIVE METABOLIC PANEL
ALT: 15 U/L (ref 0–35)
AST: 14 U/L (ref 0–37)
Albumin: 4.2 g/dL (ref 3.5–5.2)
Alkaline Phosphatase: 75 U/L (ref 39–117)
BUN: 14 mg/dL (ref 6–23)
CO2: 29 mEq/L (ref 19–32)
Calcium: 9.1 mg/dL (ref 8.4–10.5)
Chloride: 104 mEq/L (ref 96–112)
Creatinine, Ser: 0.83 mg/dL (ref 0.40–1.20)
GFR: 77.52 mL/min (ref >60.00)
Glucose, Bld: 90 mg/dL (ref 70–99)
Potassium: 4.4 mEq/L (ref 3.5–5.1)
Sodium: 139 mEq/L (ref 135–145)
Total Bilirubin: 0.4 mg/dL (ref 0.2–1.2)
Total Protein: 7.1 g/dL (ref 6.0–8.3)

## 2021-08-25 LAB — LIPID PANEL
Cholesterol: 255 mg/dL — ABNORMAL HIGH (ref 0–200)
HDL: 82 mg/dL (ref >39.00)
LDL Cholesterol: 155 mg/dL — ABNORMAL HIGH (ref 0–99)
NonHDL: 172.61
Total CHOL/HDL Ratio: 3
Triglycerides: 87 mg/dL (ref 0.0–149.0)
VLDL: 17.4 mg/dL (ref 0.0–40.0)

## 2021-08-25 LAB — HEMOGLOBIN A1C: Hgb A1c MFr Bld: 5.7 % (ref 4.6–6.5)

## 2021-08-25 LAB — TSH: TSH: 7.5 u[IU]/mL — ABNORMAL HIGH (ref 0.35–5.50)

## 2021-08-25 MED ORDER — VALACYCLOVIR HCL 1 G PO TABS: 1000.0000 mg | ORAL_TABLET | Freq: Every day | ORAL | 3 refills | Status: DC

## 2021-08-25 MED ORDER — LEVOTHYROXINE SODIUM 125 MCG PO TABS: ORAL_TABLET | ORAL | 0 refills | Status: DC

## 2021-08-25 NOTE — Assessment & Plan Note (Addendum)
She is taking her levothyroxine 125 mcg every other day for the last 1 year as she was without insurance.   Repeat TSH pending. Await results.

## 2021-08-25 NOTE — Assessment & Plan Note (Signed)
Does very well with daily suppressive treatment with Valtrex 1000 mg.   Continue Valtrex 1000 mg daily. Refills provided.

## 2021-08-25 NOTE — Assessment & Plan Note (Signed)
Chronic, stable overall.   Manages well on her own.

## 2021-08-25 NOTE — Assessment & Plan Note (Signed)
Chronic for years.  Discussed use of Flonase to start.  Add antihistamine if no improvement.  Consider ENT referral if warranted.

## 2021-08-25 NOTE — Patient Instructions (Signed)
Stop by the lab prior to leaving today. I will notify you of your results once received.   You will be contacted regarding your referral to GI.  Please let us know if you have not been contacted within two weeks.   Please consider a mammogram.   It was a pleasure to see you today!  Preventive Care 30-59 Years Old, Female Preventive care refers to lifestyle choices and visits with your health care provider that can promote health and wellness. Preventive care visits are also called wellness exams. What can I expect for my preventive care visit? Counseling Your health care provider may ask you questions about your: Medical history, including: Past medical problems. Family medical history. Pregnancy history. Current health, including: Menstrual cycle. Method of birth control. Emotional well-being. Home life and relationship well-being. Sexual activity and sexual health. Lifestyle, including: Alcohol, nicotine or tobacco, and drug use. Access to firearms. Diet, exercise, and sleep habits. Work and work Statistician. Sunscreen use. Safety issues such as seatbelt and bike helmet use. Physical exam Your health care provider will check your: Height and weight. These may be used to calculate your BMI (body mass index). BMI is a measurement that tells if you are at a healthy weight. Waist circumference. This measures the distance around your waistline. This measurement also tells if you are at a healthy weight and may help predict your risk of certain diseases, such as type 2 diabetes and high blood pressure. Heart rate and blood pressure. Body temperature. Skin for abnormal spots. What immunizations do I need? Vaccines are usually given at various ages, according to a schedule. Your health care provider will recommend vaccines for you based on your age, medical history, and lifestyle or other factors, such as travel or where you work. What tests do I need? Screening Your health care  provider may recommend screening tests for certain conditions. This may include: Lipid and cholesterol levels. Diabetes screening. This is done by checking your blood sugar (glucose) after you have not eaten for a while (fasting). Pelvic exam and Pap test. Hepatitis B test. Hepatitis C test. HIV (human immunodeficiency virus) test. STI (sexually transmitted infection) testing, if you are at risk. Lung cancer screening. Colorectal cancer screening. Mammogram. Talk with your health care provider about when you should start having regular mammograms. This may depend on whether you have a family history of breast cancer. BRCA-related cancer screening. This may be done if you have a family history of breast, ovarian, tubal, or peritoneal cancers. Bone density scan. This is done to screen for osteoporosis. Talk with your health care provider about your test results, treatment options, and if necessary, the need for more tests. Follow these instructions at home: Eating and drinking  Eat a diet that includes fresh fruits and vegetables, whole grains, lean protein, and low-fat dairy products. Take vitamin and mineral supplements as recommended by your health care provider. Do not drink alcohol if: Your health care provider tells you not to drink. You are pregnant, may be pregnant, or are planning to become pregnant. If you drink alcohol: Limit how much you have to 0-1 drink a day. Know how much alcohol is in your drink. In the [place] one drink equals one 12 oz bottle of beer (355 mL), one 5 oz glass of wine (148 mL), or one 1 oz glass of hard liquor (44 mL). Lifestyle Brush your teeth every morning and night with fluoride toothpaste. Floss one time each day. Exercise for at least 30 minutes 5  or more days each week. Do not use any products that contain nicotine or tobacco. These products include cigarettes, chewing tobacco, and vaping devices, such as e-cigarettes. If you need help quitting, ask  your health care provider. Do not use drugs. If you are sexually active, practice safe sex. Use a condom or other form of protection to prevent STIs. If you do not wish to become pregnant, use a form of birth control. If you plan to become pregnant, see your health care provider for a prepregnancy visit. Take aspirin only as told by your health care provider. Make sure that you understand how much to take and what form to take. Work with your health care provider to find out whether it is safe and beneficial for you to take aspirin daily. Find healthy ways to manage stress, such as: Meditation, yoga, or listening to music. Journaling. Talking to a trusted person. Spending time with friends and family. Minimize exposure to UV radiation to reduce your risk of skin cancer. Safety Always wear your seat belt while driving or riding in a vehicle. Do not drive: If you have been drinking alcohol. Do not ride with someone who has been drinking. When you are tired or distracted. While texting. If you have been using any mind-altering substances or drugs. Wear a helmet and other protective equipment during sports activities. If you have firearms in your house, make sure you follow all gun safety procedures. Seek help if you have been physically or sexually abused. What's next? Visit your health care provider once a year for an annual wellness visit. Ask your health care provider how often you should have your eyes and teeth checked. Stay up to date on all vaccines. This information is not intended to replace advice given to you by your health care provider. Make sure you discuss any questions you have with your health care provider. Document Revised: 01/18/2021 Document Reviewed: 01/18/2021 Elsevier Patient Education  [place].

## 2021-08-25 NOTE — Assessment & Plan Note (Signed)
Discussed the importance of a healthy diet and regular exercise in order for weight loss, and to reduce the risk of further co-morbidity. ? ?Repeat A1C pending. ?

## 2021-08-25 NOTE — Assessment & Plan Note (Signed)
Declines Shingles and influenza vaccines.  Mammogram overdue, she declines despite recommendations.  Colonoscopy overdue, referral placed to GI.  Discussed the importance of a healthy diet and regular exercise in order for weight loss, and to reduce the risk of further co-morbidity.  Exam today stable. Labs pending.

## 2021-08-25 NOTE — Progress Notes (Signed)
Subjective:    Patient ID: Lori Crawford, female    DOB: [birth year], 61 y.o.   MRN: [id number]  HPI  Lori Crawford is a very pleasant 61 y.o. female who presents today for complete physical and follow up of chronic conditions.  Immunizations: -Tetanus: 2020 -Influenza: Did not completed  -Covid-19: 3 vaccines  -Shingles: Never completed   Diet: Fair diet.  Exercise: No regular exercise.  Eye exam: Completes annually  Dental exam: Completed a over 1 year ago. Will schedule.   Pap Smear: Hysterectomy  Mammogram: Completed years ago. Due. Declines  Colonoscopy: Completed over 10 years ago, incomplete prep.   BP Readings from Last 3 Encounters:  08/25/21 124/76  07/06/19 120/78  02/19/19 116/68       Review of Systems  Constitutional:  Negative for unexpected weight change.  HENT:  Positive for sinus pressure. Negative for rhinorrhea.        Chronic sinus pressure.   Eyes:  Negative for visual disturbance.  Respiratory:  Negative for cough and shortness of breath.   Cardiovascular:  Negative for chest pain.  Gastrointestinal:  Negative for constipation and diarrhea.  Genitourinary:  Negative for difficulty urinating.  Musculoskeletal:  Negative for arthralgias and myalgias.  Skin:  Negative for rash.  Allergic/Immunologic: Negative for environmental allergies.  Neurological:  Negative for dizziness and headaches.  Psychiatric/Behavioral:  The patient is nervous/anxious.         Past Medical History:  Diagnosis Date   Allergy    Seasonal    Arthritis    Chicken pox    History of frequent urinary tract infections    Hyperlipidemia    Sinus headache    Stye 07/06/2019   Thyroid disease     Social History   Socioeconomic History   Marital status: Single    Spouse name: Not on file   Number of children: Not on file   Years of education: Not on file   Highest education level: Not on file  Occupational History   Not on file  Tobacco Use   Smoking  status: Never   Smokeless tobacco: Never  Substance and Sexual Activity   Alcohol use: Yes   Drug use: No   Sexual activity: Not on file  Other Topics Concern   Not on file  Social History Narrative   Not on file   Social Determinants of Health   Financial Resource Strain: Not on file  Food Insecurity: Not on file  Transportation Needs: Not on file  Physical Activity: Not on file  Stress: Not on file  Social Connections: Not on file  Intimate Partner Violence: Not on file    Past Surgical History:  Procedure Laterality Date   TONSILLECTOMY AND ADENOIDECTOMY      Family History  Adopted: Yes    No Known Allergies  Current Outpatient Medications on File Prior to Visit  Medication Sig Dispense Refill   levothyroxine (SYNTHROID) 125 MCG tablet TAKE 1 TABLET BY MOUTH IN  THE MORNING ON AN EMPTY  STOMACH WITH A FULL GLASS  OF WATER. NO FOOD OR OTHER  MEDICATIONS FOR 1/2 HOUR 30 tablet 0   No current facility-administered medications on file prior to visit.    BP 124/76 (BP Location: Left Arm, Patient Position: Sitting, Cuff Size: Large)    Pulse 69    Temp 97.6 F (36.4 C) (Temporal)    Ht 5\' 7"  (1.702 m)    Wt 236 lb 8 oz (107.3 kg)  SpO2 100%    BMI 37.04 kg/m  Objective:   Physical Exam HENT:     Right Ear: Tympanic membrane and ear canal normal.     Left Ear: Tympanic membrane and ear canal normal.     Nose: Nose normal.  Eyes:     Conjunctiva/sclera: Conjunctivae normal.     Pupils: Pupils are equal, round, and reactive to light.  Neck:     Thyroid: No thyromegaly.  Cardiovascular:     Rate and Rhythm: Normal rate and regular rhythm.     Heart sounds: No murmur heard. Pulmonary:     Effort: Pulmonary effort is normal.     Breath sounds: Normal breath sounds. No rales.  Abdominal:     General: Bowel sounds are normal.     Palpations: Abdomen is soft.     Tenderness: There is no abdominal tenderness.  Musculoskeletal:        General: Normal range of  motion.     Cervical back: Neck supple.  Lymphadenopathy:     Cervical: No cervical adenopathy.  Skin:    General: Skin is warm and dry.     Findings: No rash.  Neurological:     Mental Status: She is alert and oriented to person, place, and time.     Cranial Nerves: No cranial nerve deficit.     Deep Tendon Reflexes: Reflexes are normal and symmetric.  Psychiatric:        Mood and Affect: Mood normal.          Assessment & Plan:      This visit occurred during the SARS-CoV-2 public health emergency.  Safety protocols were in place, including screening questions prior to the visit, additional usage of staff PPE, and extensive cleaning of exam room while observing appropriate contact time as indicated for disinfecting solutions.

## 2021-08-28 ENCOUNTER — Telehealth: Payer: Self-pay

## 2021-08-28 NOTE — Telephone Encounter (Signed)
She will call me back tomorrow when she knows what day she can come from her work schedule

## 2021-08-29 ENCOUNTER — Telehealth: Payer: Self-pay

## 2021-08-29 NOTE — Telephone Encounter (Signed)
CALLED PATIENT NO ANSWER LEFT VOICEMAIL FOR A CALL BACK LETTER SENT 

## 2021-11-21 ENCOUNTER — Other Ambulatory Visit: Payer: Self-pay | Admitting: Primary Care

## 2021-11-21 DIAGNOSIS — E039 Hypothyroidism, unspecified: Secondary | ICD-10-CM

## 2021-11-21 NOTE — Telephone Encounter (Signed)
Patient needs repeat TSH as discussed during her visit in January 2023. Lab only appt.  ?

## 2021-11-23 NOTE — Telephone Encounter (Signed)
Left message to return call to our office.  

## 2021-12-01 NOTE — Telephone Encounter (Signed)
Patient informed that she needs labs for TSH as discussed at Jan visit. Lab appointment scheduled.  ?

## 2021-12-08 ENCOUNTER — Other Ambulatory Visit (INDEPENDENT_AMBULATORY_CARE_PROVIDER_SITE_OTHER): Payer: BC Managed Care – PPO

## 2021-12-08 DIAGNOSIS — E039 Hypothyroidism, unspecified: Secondary | ICD-10-CM

## 2021-12-08 LAB — TSH: TSH: 0.54 u[IU]/mL (ref 0.35–5.50)

## 2022-03-19 ENCOUNTER — Other Ambulatory Visit: Payer: Self-pay | Admitting: Primary Care

## 2022-03-19 DIAGNOSIS — E039 Hypothyroidism, unspecified: Secondary | ICD-10-CM

## 2022-06-23 ENCOUNTER — Other Ambulatory Visit: Payer: Self-pay | Admitting: Primary Care

## 2022-06-23 DIAGNOSIS — E039 Hypothyroidism, unspecified: Secondary | ICD-10-CM

## 2022-06-24 NOTE — Telephone Encounter (Signed)
Patient is due for CPE/follow up in late January 2024, this will be required prior to any further refills.  Please schedule.    

## 2022-06-25 NOTE — Telephone Encounter (Signed)
Patient stated that she will call back when she's ready to schedule

## 2022-09-08 ENCOUNTER — Other Ambulatory Visit: Payer: Self-pay | Admitting: Primary Care

## 2022-09-08 DIAGNOSIS — B009 Herpesviral infection, unspecified: Secondary | ICD-10-CM

## 2022-09-08 NOTE — Telephone Encounter (Signed)
Patient is due for CPE/follow up, this will be required prior to any further refills.  Please schedule, thank you!   

## 2022-09-09 ENCOUNTER — Other Ambulatory Visit: Payer: Self-pay | Admitting: Primary Care

## 2022-09-09 DIAGNOSIS — E039 Hypothyroidism, unspecified: Secondary | ICD-10-CM

## 2022-09-10 NOTE — Telephone Encounter (Signed)
Spoke to pt, pt states she'd call back to schedule

## 2022-09-10 NOTE — Telephone Encounter (Signed)
Patient is overdue for CPE/follow up, this will be required prior to any further refills.  Please schedule, thank you!   

## 2022-09-10 NOTE — Telephone Encounter (Signed)
Spoke to pt, pt stated she'd give [place] a call back

## 2022-10-13 ENCOUNTER — Other Ambulatory Visit: Payer: Self-pay | Admitting: Primary Care

## 2022-10-13 DIAGNOSIS — B009 Herpesviral infection, unspecified: Secondary | ICD-10-CM

## 2022-10-15 ENCOUNTER — Other Ambulatory Visit: Payer: Self-pay | Admitting: Primary Care

## 2022-10-15 DIAGNOSIS — E039 Hypothyroidism, unspecified: Secondary | ICD-10-CM

## 2022-10-17 ENCOUNTER — Other Ambulatory Visit: Payer: Self-pay | Admitting: Primary Care

## 2022-10-17 DIAGNOSIS — B009 Herpesviral infection, unspecified: Secondary | ICD-10-CM

## 2023-01-18 ENCOUNTER — Encounter: Payer: Self-pay | Admitting: Primary Care

## 2023-01-18 ENCOUNTER — Ambulatory Visit (INDEPENDENT_AMBULATORY_CARE_PROVIDER_SITE_OTHER): Payer: Managed Care, Other (non HMO) | Admitting: Primary Care

## 2023-01-18 VITALS — BP 122/64 | HR 66 | Temp 97.5°F | Ht 67.0 in | Wt 244.0 lb

## 2023-01-18 DIAGNOSIS — M545 Low back pain, unspecified: Secondary | ICD-10-CM

## 2023-01-18 DIAGNOSIS — E890 Postprocedural hypothyroidism: Secondary | ICD-10-CM | POA: Diagnosis not present

## 2023-01-18 DIAGNOSIS — R7303 Prediabetes: Secondary | ICD-10-CM | POA: Diagnosis not present

## 2023-01-18 DIAGNOSIS — E785 Hyperlipidemia, unspecified: Secondary | ICD-10-CM | POA: Insufficient documentation

## 2023-01-18 DIAGNOSIS — E039 Hypothyroidism, unspecified: Secondary | ICD-10-CM

## 2023-01-18 DIAGNOSIS — B009 Herpesviral infection, unspecified: Secondary | ICD-10-CM

## 2023-01-18 DIAGNOSIS — Z0000 Encounter for general adult medical examination without abnormal findings: Secondary | ICD-10-CM | POA: Diagnosis not present

## 2023-01-18 DIAGNOSIS — G8929 Other chronic pain: Secondary | ICD-10-CM

## 2023-01-18 LAB — LIPID PANEL
Cholesterol: 240 mg/dL — ABNORMAL HIGH (ref 0–200)
HDL: 76.9 mg/dL (ref >39.00)
LDL Cholesterol: 141 mg/dL — ABNORMAL HIGH (ref 0–99)
NonHDL: 163.03
Total CHOL/HDL Ratio: 3
Triglycerides: 112 mg/dL (ref 0.0–149.0)
VLDL: 22.4 mg/dL (ref 0.0–40.0)

## 2023-01-18 LAB — COMPREHENSIVE METABOLIC PANEL
ALT: 19 U/L (ref 0–35)
AST: 18 U/L (ref 0–37)
Albumin: 3.9 g/dL (ref 3.5–5.2)
Alkaline Phosphatase: 70 U/L (ref 39–117)
BUN: 9 mg/dL (ref 6–23)
CO2: 27 mEq/L (ref 19–32)
Calcium: 8.8 mg/dL (ref 8.4–10.5)
Chloride: 108 mEq/L (ref 96–112)
Creatinine, Ser: 0.77 mg/dL (ref 0.40–1.20)
GFR: 84 mL/min (ref >60.00)
Glucose, Bld: 94 mg/dL (ref 70–99)
Potassium: 4.2 mEq/L (ref 3.5–5.1)
Sodium: 142 mEq/L (ref 135–145)
Total Bilirubin: 0.4 mg/dL (ref 0.2–1.2)
Total Protein: 6.7 g/dL (ref 6.0–8.3)

## 2023-01-18 LAB — HEMOGLOBIN A1C: Hgb A1c MFr Bld: 5.7 % (ref 4.6–6.5)

## 2023-01-18 LAB — TSH: TSH: 2.64 u[IU]/mL (ref 0.35–5.50)

## 2023-01-18 LAB — C-REACTIVE PROTEIN: CRP: <1 mg/dL (ref 0.5–20.0)

## 2023-01-18 MED ORDER — LEVOTHYROXINE SODIUM 125 MCG PO TABS: ORAL_TABLET | ORAL | 0 refills | Status: DC

## 2023-01-18 MED ORDER — VALACYCLOVIR HCL 1 G PO TABS: 1000.0000 mg | ORAL_TABLET | Freq: Every day | ORAL | 0 refills | Status: AC | PRN

## 2023-01-18 NOTE — Assessment & Plan Note (Signed)
Declines Shingrix vaccines.  Mammogram overdue, she declines despite recommendations.  Colonoscopy overdue, she is aware but declines. She will notify when ready.   Discussed the importance of a healthy diet and regular exercise in order for weight loss, and to reduce the risk of further co-morbidity.  Exam stable. Labs pending.  Follow up in 1 year for repeat physical.

## 2023-01-18 NOTE — Assessment & Plan Note (Signed)
Repeat lipid panel pending.  Discussed the importance of a healthy diet and regular exercise in order for weight loss, and to reduce the risk of further co-morbidity.  

## 2023-01-18 NOTE — Patient Instructions (Signed)
Stop by the lab prior to leaving today. I will notify you of your results once received.   It was a pleasure to see you today!  

## 2023-01-18 NOTE — Assessment & Plan Note (Signed)
Commended her on eating a plant based diet! Encouraged physical exercise.  Repeat lipid panel pending.

## 2023-01-18 NOTE — Assessment & Plan Note (Signed)
No longer on suppressive treatment x 1 year. Continue Valtrex 1000 mg PRN.

## 2023-01-18 NOTE — Assessment & Plan Note (Signed)
Stable. No concerns today 

## 2023-01-18 NOTE — Progress Notes (Signed)
Subjective:    Patient ID: Lori Crawford, female    DOB: 1960/09/28, 62 y.o.   MRN: [id number]  HPI  Lori Crawford is a very pleasant 62 y.o. female who presents today for complete physical and follow up of chronic conditions.  Immunizations: -Tetanus: Completed in 2020 -Shingles: Never completed  Diet: Fair diet. She recently began eating a plant based diet.  Exercise: No regular exercise.  Eye exam: Completes annually  Dental exam: Completes semi-annually    Pap Smear: Hysterectomy  Mammogram: Completed years ago. Declines.   Colonoscopy: Completed > 10 years ago, incomplete prep. Declines for now.   BP Readings from Last 3 Encounters:  01/18/23 122/64  08/25/21 124/76  07/06/19 120/78      Review of Systems  Constitutional:  Negative for unexpected weight change.  HENT:  Negative for rhinorrhea.   Respiratory:  Negative for cough and shortness of breath.   Cardiovascular:  Negative for chest pain.  Gastrointestinal:  Negative for constipation and diarrhea.  Genitourinary:  Negative for difficulty urinating.  Musculoskeletal:  Positive for arthralgias and back pain.  Skin:  Negative for rash.  Allergic/Immunologic: Negative for environmental allergies.  Neurological:  Negative for dizziness and headaches.  Psychiatric/Behavioral:  The patient is not nervous/anxious.          Past Medical History:  Diagnosis Date   Allergy    Seasonal    Arthritis    Chicken pox    History of frequent urinary tract infections    Hyperlipidemia    Retrocalcaneal bursitis 08/26/2015   Rhomboid muscle strain, initial encounter 02/02/2019   Sinus headache    Stye 07/06/2019   Thyroid disease     Social History   Socioeconomic History   Marital status: Single    Spouse name: Not on file   Number of children: Not on file   Years of education: Not on file   Highest education level: Not on file  Occupational History   Not on file  Tobacco Use   Smoking status:  Never   Smokeless tobacco: Never  Substance and Sexual Activity   Alcohol use: Yes   Drug use: No   Sexual activity: Not on file  Other Topics Concern   Not on file  Social History Narrative   Not on file   Social Determinants of Health   Financial Resource Strain: Not on file  Food Insecurity: Not on file  Transportation Needs: Not on file  Physical Activity: Not on file  Stress: Not on file  Social Connections: Not on file  Intimate Partner Violence: Not on file    Past Surgical History:  Procedure Laterality Date   TONSILLECTOMY AND ADENOIDECTOMY      Family History  Adopted: Yes    No Known Allergies  No current outpatient medications on file prior to visit.   No current facility-administered medications on file prior to visit.    BP 122/64   Pulse 66   Temp (!) 97.5 F (36.4 C) (Temporal)   Ht 5\' 7"  (1.702 m)   Wt 244 lb (110.7 kg)   SpO2 97%   BMI 38.22 kg/m  Objective:   Physical Exam HENT:     Right Ear: Tympanic membrane and ear canal normal.     Left Ear: Tympanic membrane and ear canal normal.     Nose: Nose normal.  Eyes:     Conjunctiva/sclera: Conjunctivae normal.     Pupils: Pupils are equal, round, and reactive to light.  Neck:     Thyroid: No thyromegaly.  Cardiovascular:     Rate and Rhythm: Normal rate and regular rhythm.     Heart sounds: No murmur heard. Pulmonary:     Effort: Pulmonary effort is normal.     Breath sounds: Normal breath sounds. No rales.  Abdominal:     General: Bowel sounds are normal.     Palpations: Abdomen is soft.     Tenderness: There is no abdominal tenderness.  Musculoskeletal:        General: Normal range of motion.     Cervical back: Neck supple.  Lymphadenopathy:     Cervical: No cervical adenopathy.  Skin:    General: Skin is warm and dry.     Findings: No rash.  Neurological:     Mental Status: She is alert and oriented to person, place, and time.     Cranial Nerves: No cranial nerve  deficit.     Deep Tendon Reflexes: Reflexes are normal and symmetric.  Psychiatric:        Mood and Affect: Mood normal.           Assessment & Plan:  Preventative health care Assessment & Plan: Declines Shingrix vaccines.  Mammogram overdue, she declines despite recommendations.  Colonoscopy overdue, she is aware but declines. She will notify when ready.   Discussed the importance of a healthy diet and regular exercise in order for weight loss, and to reduce the risk of further co-morbidity.  Exam stable. Labs pending.  Follow up in 1 year for repeat physical.    Herpes simplex Assessment & Plan: No longer on suppressive treatment x 1 year. Continue Valtrex 1000 mg PRN.   Orders: -     valACYclovir HCl; Take 1 tablet (1,000 mg total) by mouth daily as needed.  Dispense: 30 tablet; Refill: 0  Postoperative hypothyroidism Assessment & Plan: She has not been taking levothyroxine in quite some time as she didn't want to take. She did resume 1 week ago.   Repeat TSH pending. Will repeat again in 2 months.  She has been taking levothyroxine correctly.  Continue levothyroxine 125 mcg.  Orders: -     TSH  Hypothyroidism, unspecified type Assessment & Plan: She has not been taking levothyroxine in quite some time as she didn't want to take. She did resume 1 week ago.   Repeat TSH pending. Will repeat again in 2 months.  She has been taking levothyroxine correctly.  Continue levothyroxine 125 mcg.  Orders: -     Levothyroxine Sodium; Take 1 tablet by mouth every morning on an empty stomach with water only.  No food or other medications for 30 minutes.  Dispense: 90 tablet; Refill: 0  Prediabetes Assessment & Plan: Repeat lipid panel pending.  Discussed the importance of a healthy diet and regular exercise in order for weight loss, and to reduce the risk of further co-morbidity.   Orders: -     Hemoglobin A1c  Chronic right-sided low back pain without  sciatica Assessment & Plan: Stable.  No concerns today.   Hyperlipidemia, unspecified hyperlipidemia type Assessment & Plan: Commended her on eating a plant based diet! Encouraged physical exercise.  Repeat lipid panel pending.  Orders: -     Lipid panel -     Comprehensive metabolic panel -     C-reactive protein        [name], NP

## 2023-01-18 NOTE — Assessment & Plan Note (Signed)
She has not been taking levothyroxine in quite some time as she didn't want to take. She did resume 1 week ago.   Repeat TSH pending. Will repeat again in 2 months.  She has been taking levothyroxine correctly.  Continue levothyroxine 125 mcg.

## 2023-02-06 ENCOUNTER — Ambulatory Visit: Payer: Managed Care, Other (non HMO) | Admitting: Primary Care

## 2023-03-05 ENCOUNTER — Ambulatory Visit: Payer: Managed Care, Other (non HMO) | Admitting: Primary Care

## 2023-03-05 ENCOUNTER — Encounter: Payer: Self-pay | Admitting: Primary Care

## 2023-03-05 VITALS — BP 124/68 | HR 65 | Temp 97.3°F | Ht 67.0 in | Wt 250.0 lb

## 2023-03-05 DIAGNOSIS — J309 Allergic rhinitis, unspecified: Secondary | ICD-10-CM | POA: Insufficient documentation

## 2023-03-05 MED ORDER — MONTELUKAST SODIUM 10 MG PO TABS
10.0000 mg | ORAL_TABLET | Freq: Every day | ORAL | 0 refills | Status: DC
Start: 2023-03-05 — End: 2023-06-24

## 2023-03-05 NOTE — Assessment & Plan Note (Signed)
Uncontrolled. No respiratory compromise or illness noted.  Discussed importance of regular use of antihistamine and Flonase.  Start montelukast 10 mg at bedtime. Start Zyrtec 10 mg in a.m.  Discussed use of Flonase, 1 spray in each nostril twice daily. She will update.

## 2023-03-05 NOTE — Patient Instructions (Signed)
Start montelukast (Singulair) for allergies.  Take 1 tablet by mouth every evening at bedtime.  Start Zyrtec consistently.  Take 10 mg every morning.  Try using Flonase (fluticasone) nasal spray. Instill 1 spray in each nostril twice daily.   It was a pleasure to see you today!

## 2023-03-05 NOTE — Progress Notes (Signed)
Subjective:    Patient ID: Lori Crawford, female    DOB: [birth year], 62 y.o.   MRN: [id number]  HPI  Lori Crawford is a very pleasant 62 y.o. female with a history of hypothyroidism, prediabetes, hyperlipidemia, chronic allergies, who presents today to discuss allergy symptoms.  Symptoms include itchy ears, itching to the back of the throat, skin itching to the upper chest, sneezing, "rattling to my chest", "wheezing in the evening", nasal congestion, "my airways feel inflamed". Her shortness of breath is affected by the weather and chemical fumes.   She has a chronic history of allergies, but over the last 2-3 weeks her symptoms have progressed. She's been picking up mushrooms in her yard recently to prevent her dog from eating them. Isn't sure if this is the cause for her increased symptoms.   She's taken Mucinex which has helped. She's been taking Zyrtec irregularly regularly which helps slightly.   She denies post nasal drip, sinus pressure, fevers, chills, history of asthma, rashes, recent tick bites. She does not eat beef or pork. She is a non smoker.    Review of Systems  Constitutional:  Negative for chills and fever.  HENT:  Positive for congestion. Negative for postnasal drip, sinus pressure and sore throat.   Respiratory:  Positive for shortness of breath. Negative for cough.   Allergic/Immunologic: Positive for environmental allergies.         Past Medical History:  Diagnosis Date   Allergy    Seasonal    Arthritis    Chicken pox    History of frequent urinary tract infections    Hyperlipidemia    Retrocalcaneal bursitis 08/26/2015   Rhomboid muscle strain, initial encounter 02/02/2019   Sinus headache    Stye 07/06/2019   Thyroid disease     Social History   Socioeconomic History   Marital status: Single    Spouse name: Not on file   Number of children: Not on file   Years of education: Not on file   Highest education level: Not on file  Occupational  History   Not on file  Tobacco Use   Smoking status: Never   Smokeless tobacco: Never  Substance and Sexual Activity   Alcohol use: Yes   Drug use: No   Sexual activity: Not on file  Other Topics Concern   Not on file  Social History Narrative   Not on file   Social Determinants of Health   Financial Resource Strain: Not on file  Food Insecurity: Not on file  Transportation Needs: Not on file  Physical Activity: Not on file  Stress: Not on file  Social Connections: Not on file  Intimate Partner Violence: Not on file    Past Surgical History:  Procedure Laterality Date   TONSILLECTOMY AND ADENOIDECTOMY      Family History  Adopted: Yes    No Known Allergies  Current Outpatient Medications on File Prior to Visit  Medication Sig Dispense Refill   levothyroxine (SYNTHROID) 125 MCG tablet Take 1 tablet by mouth every morning on an empty stomach with water only.  No food or other medications for 30 minutes. 90 tablet 0   valACYclovir (VALTREX) 1000 MG tablet Take 1 tablet (1,000 mg total) by mouth daily as needed. 30 tablet 0   No current facility-administered medications on file prior to visit.    BP 124/68   Pulse 65   Temp (!) 97.3 F (36.3 C) (Temporal)   Ht 5\' 7"  (1.702 m)  Wt 250 lb (113.4 kg)   SpO2 98%   BMI 39.16 kg/m  Objective:   Physical Exam HENT:     Right Ear: Tympanic membrane and ear canal normal.     Left Ear: Tympanic membrane and ear canal normal.     Nose:     Right Sinus: No maxillary sinus tenderness or frontal sinus tenderness.     Left Sinus: No maxillary sinus tenderness or frontal sinus tenderness.     Mouth/Throat:     Pharynx: No posterior oropharyngeal erythema.  Eyes:     Conjunctiva/sclera: Conjunctivae normal.  Cardiovascular:     Rate and Rhythm: Normal rate and regular rhythm.  Pulmonary:     Effort: Pulmonary effort is normal.     Breath sounds: Normal breath sounds. No wheezing or rales.  Musculoskeletal:      Cervical back: Neck supple.  Lymphadenopathy:     Cervical: No cervical adenopathy.  Skin:    General: Skin is warm and dry.     Findings: No rash.           Assessment & Plan:  Allergic rhinitis, unspecified seasonality, unspecified trigger Assessment & Plan: Uncontrolled. No respiratory compromise or illness noted.  Discussed importance of regular use of antihistamine and Flonase.  Start montelukast 10 mg at bedtime. Start Zyrtec 10 mg in a.m.  Discussed use of Flonase, 1 spray in each nostril twice daily. She will update.  Orders: -     Montelukast Sodium; Take 1 tablet (10 mg total) by mouth at bedtime. For allergies  Dispense: 90 tablet; Refill: 0        [name], NP

## 2023-03-08 ENCOUNTER — Other Ambulatory Visit: Payer: Self-pay | Admitting: Primary Care

## 2023-03-17 ENCOUNTER — Other Ambulatory Visit: Payer: Self-pay | Admitting: Primary Care

## 2023-03-17 DIAGNOSIS — E039 Hypothyroidism, unspecified: Secondary | ICD-10-CM

## 2023-03-22 ENCOUNTER — Other Ambulatory Visit: Payer: Managed Care, Other (non HMO)

## 2023-03-27 ENCOUNTER — Other Ambulatory Visit: Payer: Self-pay | Admitting: Primary Care

## 2023-03-27 ENCOUNTER — Telehealth: Payer: Self-pay | Admitting: Primary Care

## 2023-03-27 DIAGNOSIS — R053 Chronic cough: Secondary | ICD-10-CM

## 2023-03-27 DIAGNOSIS — J309 Allergic rhinitis, unspecified: Secondary | ICD-10-CM

## 2023-03-27 NOTE — Telephone Encounter (Signed)
Patient called in saying that the  montelukast (SINGULAIR) 10 MG tablet   and the zyrtec isn't working and wants to know if she need to come in and get more treatment or can doctor call something else in for her.

## 2023-03-29 NOTE — Telephone Encounter (Signed)
Called patient states that she has received message from [place]. Has tried everything recommended but not having any improvement. Offered office visit but patient declined.

## 2023-04-01 ENCOUNTER — Ambulatory Visit
Admission: EM | Admit: 2023-04-01 | Discharge: 2023-04-01 | Disposition: A | Discharge status: Home / Self Care | Payer: Managed Care, Other (non HMO) | Attending: Emergency Medicine | Admitting: Emergency Medicine

## 2023-04-01 DIAGNOSIS — J069 Acute upper respiratory infection, unspecified: Secondary | ICD-10-CM

## 2023-04-01 MED ORDER — AMOXICILLIN-POT CLAVULANATE 875-125 MG PO TABS: 1.0000 | ORAL_TABLET | Freq: Two times a day (BID) | ORAL | 0 refills | Status: AC

## 2023-04-01 MED ORDER — PREDNISONE 20 MG PO TABS: 40.0000 mg | ORAL_TABLET | Freq: Every day | ORAL | 0 refills | Status: DC

## 2023-04-01 NOTE — ED Provider Notes (Signed)
[name]    CSN: [id number] Arrival date & time: 04/01/23  1757      History   Chief Complaint Chief Complaint  Patient presents with   Cough    HPI Lori Crawford is a 62 y.o. female.   Patient presents for evaluation of mild nasal congestion, productive cough, shortness of breath and wheezing experienced with coughing present for 2 weeks.  Feels a rattling to the center of the chest .  Persistent coughing has caught episodes of urinary incontinence.  Having frequent coughing spells making it difficult to breathe.  Symptoms worsening.  Initially thought to be seasonal allergies as she experiences persistent itching along the center of the chest wall and back, started taking Zyrtec and Singulair upon recommendation of her primary doctor, no improvement seen with symptoms.  History of bronchitis and pneumonia.  Denies presence of chest pain or tightness, fever chills body aches, ear pain or sore throat.  Past Medical History:  Diagnosis Date   Allergy    Seasonal    Arthritis    Chicken pox    History of frequent urinary tract infections    Hyperlipidemia    Retrocalcaneal bursitis 08/26/2015   Rhomboid muscle strain, initial encounter 02/02/2019   Sinus headache    Stye 07/06/2019   Thyroid disease     Patient Active Problem List   Diagnosis Date Noted   Allergic rhinitis 03/05/2023   Hyperlipidemia 01/18/2023   Prediabetes 08/25/2021   Preventative health care 01/09/2019   Anxiety 08/27/2018   Chronic back pain 02/28/2018   Herpes simplex 08/08/2016   Obesity (BMI 35.0-39.9 without comorbidity) 08/08/2016   Hypothyroidism 08/26/2015    Past Surgical History:  Procedure Laterality Date   TONSILLECTOMY AND ADENOIDECTOMY      OB History   No obstetric history on file.      Home Medications    Prior to Admission medications   Medication Sig Start Date End Date Taking? Authorizing Provider  amoxicillin-clavulanate (AUGMENTIN) 875-125 MG tablet  Take 1 tablet by mouth every 12 (twelve) hours for 10 days. 04/01/23 04/11/23 Yes Raul Torrance R, NP  predniSONE (DELTASONE) 20 MG tablet Take 2 tablets (40 mg total) by mouth daily. 04/01/23  Yes Sansa Alkema R, NP  levothyroxine (SYNTHROID) 125 MCG tablet TAKE 1 TABLET BY MOUTH EVERY MORNING ON AN EMPTY STOMACH WITH WATER ONLY. NO FOOD OR OTHER MEDICATIONS FOR 30 MINUTES. 03/17/23   [name], NP  montelukast (SINGULAIR) 10 MG tablet Take 1 tablet (10 mg total) by mouth at bedtime. For allergies 03/05/23   [name], NP  valACYclovir (VALTREX) 1000 MG tablet Take 1 tablet (1,000 mg total) by mouth daily as needed. 01/18/23   [name], NP    Family History Family History  Adopted: Yes    Social History Social History   Tobacco Use   Smoking status: Never   Smokeless tobacco: Never  Substance Use Topics   Alcohol use: Yes   Drug use: No     Allergies   Patient has no known allergies.   Review of Systems Review of Systems   Physical Exam Triage Vital Signs ED Triage Vitals [04/01/23 1827]  Encounter Vitals Group     BP (!) 144/86     Systolic BP Percentile      Diastolic BP Percentile      Pulse Rate 88     Resp 20     Temp 97.8 F (36.6 C)  Temp Source Oral     SpO2 95 %     Weight 250 lb (113.4 kg)     Height 5\' 7"  (1.702 m)     Head Circumference      Peak Flow      Pain Score 0     Pain Loc      Pain Education      Exclude from Growth Chart    No data found.  Updated Vital Signs BP (!) 144/86   Pulse 88   Temp 97.8 F (36.6 C) (Oral)   Resp 20   Ht 5\' 7"  (1.702 m)   Wt 250 lb (113.4 kg)   SpO2 95%   BMI 39.16 kg/m   Visual Acuity Right Eye Distance:   Left Eye Distance:   Bilateral Distance:    Right Eye Near:   Left Eye Near:    Bilateral Near:     Physical Exam Constitutional:      Appearance: Normal appearance.  HENT:     Right Ear: Tympanic membrane, ear canal and external ear normal.     Left Ear:  Tympanic membrane, ear canal and external ear normal.     Nose: Congestion present. No rhinorrhea.     Mouth/Throat:     Mouth: Mucous membranes are moist.     Pharynx: Oropharynx is clear.  Eyes:     Extraocular Movements: Extraocular movements intact.  Cardiovascular:     Rate and Rhythm: Normal rate and regular rhythm.     Pulses: Normal pulses.     Heart sounds: Normal heart sounds.  Pulmonary:     Effort: Pulmonary effort is normal.     Breath sounds: Wheezing present.  Musculoskeletal:     Cervical back: Normal range of motion and neck supple.  Neurological:     Mental Status: She is alert and oriented to person, place, and time. Mental status is at baseline.      UC Treatments / Results  Labs (all labs ordered are listed, but only abnormal results are displayed) Labs Reviewed - No data to display  EKG   Radiology No results found.  Procedures Procedures (including critical care time)  Medications Ordered in UC Medications - No data to display  Initial Impression / Assessment and Plan / UC Course  I have reviewed the triage vital signs and the nursing notes.  Pertinent labs & imaging results that were available during my care of the patient were reviewed by me and considered in my medical decision making (see chart for details).  Acute upper respiratory infection  Vital signs are stable, wheezing heard to auscultation, O2 saturation 95% on room air, patient is in no signs of distress, stable for outpatient management, symptoms persisting for 2 weeks, low suspicion for pneumonia, at this time will defer x-ray imaging, patient in agreement with plan of care, prescribed Augmentin and prednisone, has attempted use of Tessalon in the past, not effective will defer, may continue use of antihistamine, Singulair and over-the-counter medications for support, given strict precautions that if symptoms persist or worsen at any point to follow-up for reevaluation and at that  time chest x-ray can be completed Final Clinical Impressions(s) / UC Diagnoses   Final diagnoses:  Acute URI     Discharge Instructions      On Exam able to hear wheezing with mild lungs, low suspicion for pneumonia at this time based on examination therefore we have held off on completing x-ray imaging as it would not  change the course of treatment plan  Begin Augmentin every morning and every evening for 10 days, medicine will reach peak within the body in 48 hours but should see small improvements diabetic  Starting tomorrow take prednisone every morning with food for 5 days, will open and relax the airway, making it easier for you to breathe and relaxing and wheezing and shortness of breath    You can take Tylenol  as needed for fever reduction and pain relief.   For cough: honey 1/2 to 1 teaspoon (you can dilute the honey in water or another fluid).  You can also use guaifenesin and dextromethorphan for cough. You can use a humidifier for chest congestion and cough.  If you don't have a humidifier, you can sit in the bathroom with the hot shower running.      For sore throat: try warm salt water gargles, cepacol lozenges, throat spray, warm tea or water with lemon/honey, popsicles or ice, or OTC cold relief medicine for throat discomfort.   For congestion: take a daily anti-histamine like Zyrtec, Claritin, and a oral decongestant, such as pseudoephedrine.  You can also use Flonase 1-2 sprays in each nostril daily.   It is important to stay hydrated: drink plenty of fluids (water, gatorade/powerade/pedialyte, juices, or teas) to keep your throat moisturized and help further relieve irritation/discomfort.    ED Prescriptions     Medication Sig Dispense Auth. Provider   amoxicillin-clavulanate (AUGMENTIN) 875-125 MG tablet Take 1 tablet by mouth every 12 (twelve) hours for 10 days. 20 tablet Gerik Coberly R, NP   predniSONE (DELTASONE) 20 MG tablet Take 2 tablets (40 mg total) by  mouth daily. 10 tablet Valinda Hoar, NP      PDMP not reviewed this encounter.   Valinda Hoar, [place] 04/01/23 [phone]

## 2023-04-01 NOTE — ED Triage Notes (Signed)
Patient to Urgent Care with complaints of chest congestion and cough.   Reports she was seen by her pcp two weeks ago and prescribed zyrtec and Singulair. Reports she has been having coughing spells that are causing her to have urinary incontinence. Unable to produce much when coughing.  Denies fevers. Hx of pneumonia.

## 2023-04-01 NOTE — Discharge Instructions (Signed)
On Exam able to hear wheezing with mild lungs, low suspicion for pneumonia at this time based on examination therefore we have held off on completing x-ray imaging as it would not change the course of treatment plan  Begin Augmentin every morning and every evening for 10 days, medicine will reach peak within the body in 48 hours but should see small improvements diabetic  Starting tomorrow take prednisone every morning with food for 5 days, will open and relax the airway, making it easier for you to breathe and relaxing and wheezing and shortness of breath    You can take Tylenol  as needed for fever reduction and pain relief.   For cough: honey 1/2 to 1 teaspoon (you can dilute the honey in water or another fluid).  You can also use guaifenesin and dextromethorphan for cough. You can use a humidifier for chest congestion and cough.  If you don't have a humidifier, you can sit in the bathroom with the hot shower running.      For sore throat: try warm salt water gargles, cepacol lozenges, throat spray, warm tea or water with lemon/honey, popsicles or ice, or OTC cold relief medicine for throat discomfort.   For congestion: take a daily anti-histamine like Zyrtec, Claritin, and a oral decongestant, such as pseudoephedrine.  You can also use Flonase 1-2 sprays in each nostril daily.   It is important to stay hydrated: drink plenty of fluids (water, gatorade/powerade/pedialyte, juices, or teas) to keep your throat moisturized and help further relieve irritation/discomfort.

## 2023-05-16 ENCOUNTER — Encounter: Payer: Self-pay | Admitting: Primary Care

## 2023-05-16 ENCOUNTER — Ambulatory Visit: Payer: Managed Care, Other (non HMO) | Admitting: Primary Care

## 2023-05-16 ENCOUNTER — Ambulatory Visit
Admission: RE | Admit: 2023-05-16 | Discharge: 2023-05-16 | Disposition: A | Discharge status: Home / Self Care | Payer: Managed Care, Other (non HMO) | Source: Ambulatory Visit | Attending: Primary Care | Admitting: Primary Care

## 2023-05-16 VITALS — BP 132/78 | HR 78 | Temp 97.7°F | Ht 67.0 in | Wt 252.0 lb

## 2023-05-16 DIAGNOSIS — L299 Pruritus, unspecified: Secondary | ICD-10-CM | POA: Diagnosis not present

## 2023-05-16 DIAGNOSIS — R053 Chronic cough: Secondary | ICD-10-CM | POA: Diagnosis not present

## 2023-05-16 MED ORDER — ASMANEX HFA 100 MCG/ACT IN AERO: 1.0000 | INHALATION_SPRAY | Freq: Two times a day (BID) | RESPIRATORY_TRACT | 0 refills | Status: DC

## 2023-05-16 NOTE — Assessment & Plan Note (Signed)
Checking alpha gal level today.  Continue montelukast 10 mg daily and Zyrtec 10 mg daily.

## 2023-05-16 NOTE — Patient Instructions (Signed)
Start mometasone (Asmanex) 100 mcg dose inhaler.  Inhale 1 puff into the lungs twice daily every day.  Rinse your mouth after each use.  Continue Singulair, Zyrtec, omeprazole as discussed.  Stop by the lab and xray prior to leaving today. I will notify you of your results once received.   It was a pleasure to see you today!

## 2023-05-16 NOTE — Assessment & Plan Note (Addendum)
Differentials include allergies, GERD, asthma, CHF.  Checking labs today including BNP. Chest x-ray ordered and pending.  Start Asmanex 100 mcg inhaler.  Inhale 1 puff into the lungs twice daily.  Discussed to rinse mouth after each use. Continue montelukast 10 mg daily and Zyrtec 10 mg daily. Continue omeprazole 20 mg daily.  Consider referral to pulmonology.

## 2023-05-16 NOTE — Progress Notes (Signed)
Subjective:    Patient ID: Lori Crawford, female    DOB: Sep 24, 1960, 62 y.o.   MRN: [id number]  Cough Associated symptoms include wheezing. Pertinent negatives include no fever, postnasal drip, rash, rhinorrhea or sore throat. Her past medical history is significant for environmental allergies.    Lori Crawford is a very pleasant 62 y.o. female with a history of allergic rhinitis, hypothyroidism, prediabetes, hyperlipidemia, anxiety who presents today to discuss cough.  Her cough began in late July with a dry cough, itchy eyes, nasal congestion, "wheezing". Her allergy symptoms are intermittent, however, her cough is daily. Her cough is dry for which she feels to the upper mid chest. She will also cough so hard that she has to wear panty liners due to incontinence. She will also experience coughing fits. She has also noticed shortness of breath with mild exertion, for example walking from the door of her office to her car.  Also with intermittent itching to her ears and skin to the chest.  She denies a prior history of asthma, smoking. She is compliant to Singulair 10 mg and Zyrtec daily which was recommended in late July during her last visit. She has been taking omeprazole 20 mg daily OTC.  Wt Readings from Last 3 Encounters:  05/16/23 252 lb (114.3 kg)  04/01/23 250 lb (113.4 kg)  03/05/23 250 lb (113.4 kg)     Review of Systems  Constitutional:  Negative for fever.  HENT:  Negative for congestion, postnasal drip, rhinorrhea and sore throat.   Respiratory:  Positive for cough, chest tightness and wheezing.   Skin:  Negative for rash.       Pruritus  Allergic/Immunologic: Positive for environmental allergies.         Past Medical History:  Diagnosis Date   Allergy    Seasonal    Arthritis    Chicken pox    History of frequent urinary tract infections    Hyperlipidemia    Retrocalcaneal bursitis 08/26/2015   Rhomboid muscle strain, initial encounter 02/02/2019   Sinus  headache    Stye 07/06/2019   Thyroid disease     Social History   Socioeconomic History   Marital status: Single    Spouse name: Not on file   Number of children: Not on file   Years of education: Not on file   Highest education level: Not on file  Occupational History   Not on file  Tobacco Use   Smoking status: Never   Smokeless tobacco: Never  Substance and Sexual Activity   Alcohol use: Yes   Drug use: No   Sexual activity: Not on file  Other Topics Concern   Not on file  Social History Narrative   Not on file   Social Determinants of Health   Financial Resource Strain: Not on file  Food Insecurity: Not on file  Transportation Needs: Not on file  Physical Activity: Not on file  Stress: Not on file  Social Connections: Not on file  Intimate Partner Violence: Not on file    Past Surgical History:  Procedure Laterality Date   TONSILLECTOMY AND ADENOIDECTOMY      Family History  Adopted: Yes    No Known Allergies  Current Outpatient Medications on File Prior to Visit  Medication Sig Dispense Refill   levothyroxine (SYNTHROID) 125 MCG tablet TAKE 1 TABLET BY MOUTH EVERY MORNING ON AN EMPTY STOMACH WITH WATER ONLY. NO FOOD OR OTHER MEDICATIONS FOR 30 MINUTES. 90 tablet 2   montelukast (  SINGULAIR) 10 MG tablet Take 1 tablet (10 mg total) by mouth at bedtime. For allergies 90 tablet 0   valACYclovir (VALTREX) 1000 MG tablet Take 1 tablet (1,000 mg total) by mouth daily as needed. 30 tablet 0   No current facility-administered medications on file prior to visit.    BP 132/78   Pulse 78   Temp 97.7 F (36.5 C) (Temporal)   Ht 5\' 7"  (1.702 m)   Wt 252 lb (114.3 kg)   SpO2 98%   BMI 39.47 kg/m  Objective:   Physical Exam Constitutional:      Appearance: She is not ill-appearing.  HENT:     Right Ear: Tympanic membrane and ear canal normal.     Left Ear: Tympanic membrane and ear canal normal.     Nose: No mucosal edema.     Right Sinus: No maxillary  sinus tenderness or frontal sinus tenderness.     Left Sinus: No maxillary sinus tenderness or frontal sinus tenderness.     Mouth/Throat:     Mouth: Mucous membranes are moist.  Eyes:     Conjunctiva/sclera: Conjunctivae normal.  Cardiovascular:     Rate and Rhythm: Normal rate and regular rhythm.  Pulmonary:     Effort: Pulmonary effort is normal.     Breath sounds: Examination of the right-upper field reveals wheezing. Examination of the left-upper field reveals wheezing. Examination of the right-lower field reveals wheezing. Wheezing present. No rhonchi.  Musculoskeletal:     Cervical back: Neck supple.  Skin:    General: Skin is warm and dry.           Assessment & Plan:  Persistent cough for 3 weeks or longer Assessment & Plan: Differentials include allergies, GERD, asthma, CHF.  Checking labs today including BNP. Chest x-ray ordered and pending.  Start Asmanex 100 mcg inhaler.  Inhale 1 puff into the lungs twice daily.  Discussed to rinse mouth after each use. Continue montelukast 10 mg daily and Zyrtec 10 mg daily. Continue omeprazole 20 mg daily.  Consider referral to pulmonology.  Orders: -     DG Chest 2 View -     Brain natriuretic peptide  Pruritus Assessment & Plan: Checking alpha gal level today.  Continue montelukast 10 mg daily and Zyrtec 10 mg daily.    Orders: -     Alpha-Gal Panel  Other orders -     Asmanex HFA; Inhale 1 puff into the lungs 2 (two) times daily. Rinse mouth after each use.  Dispense: 13 g; Refill: 0        [name], NP

## 2023-05-17 LAB — BRAIN NATRIURETIC PEPTIDE: Pro B Natriuretic peptide (BNP): 23 pg/mL (ref 0.0–100.0)

## 2023-05-20 LAB — INTERPRETATION:

## 2023-05-20 LAB — ALPHA-GAL PANEL
Allergen, Mutton, f88: <0.1 kU/L
Allergen, Pork, f26: <0.1 kU/L
Beef: <0.1 kU/L
CLASS: 0
CLASS: 0
Class: 0
GALACTOSE-ALPHA-1,3-GALACTOSE IGE*: <0.1 kU/L (ref <0.10)

## 2023-05-22 ENCOUNTER — Other Ambulatory Visit: Payer: Self-pay | Admitting: Primary Care

## 2023-05-22 DIAGNOSIS — R053 Chronic cough: Secondary | ICD-10-CM

## 2023-05-22 NOTE — Telephone Encounter (Signed)
Terri or Erin,  Can we get the chest xray results for this patient?

## 2023-06-08 ENCOUNTER — Ambulatory Visit
Admission: EM | Admit: 2023-06-08 | Discharge: 2023-06-08 | Disposition: A | Discharge status: Home / Self Care | Payer: Managed Care, Other (non HMO) | Attending: Emergency Medicine | Admitting: Emergency Medicine

## 2023-06-08 DIAGNOSIS — H538 Other visual disturbances: Secondary | ICD-10-CM

## 2023-06-08 DIAGNOSIS — H00014 Hordeolum externum left upper eyelid: Secondary | ICD-10-CM | POA: Diagnosis not present

## 2023-06-08 MED ORDER — POLYMYXIN B-TRIMETHOPRIM 10000-0.1 UNIT/ML-% OP SOLN
1.0000 [drp] | Freq: Four times a day (QID) | OPHTHALMIC | 0 refills | Status: AC
Start: 2023-06-08 — End: 2023-06-15

## 2023-06-08 NOTE — ED Triage Notes (Signed)
Patient to Urgent Care with complaints of left sided eye pain/ irritation. Has had a stye for over a week. Now having itching and some blurred vision that started on Wednesday.  Using a triple abx ointment, using warm compresses.

## 2023-06-08 NOTE — Discharge Instructions (Addendum)
Use the antibiotic eyedrops as prescribed.    Follow up with an eye care provider on Monday.  Go to the emergency department if you have worsening symptoms.

## 2023-06-08 NOTE — ED Provider Notes (Signed)
Lori Crawford    CSN: [id number] Arrival date & time: 06/08/23  1411      History   Chief Complaint Chief Complaint  Patient presents with   Eye Problem    HPI Lori Crawford is a 62 y.o. female.  Patient presents with >1 week history of stye on her left upper eyelid.  She reports drainage at night, itching, blurred vision x 4 days.  She started using triple antibiotic ointment on her eyelid yesterday.  She has also used warm compresses.  No eye trauma, flashing lights, dark curtains coming down over her visual field, eye pain other than her eyelid, fever, or other symptoms.  She states she had her annual eye exam 2 weeks ago and everything was normal.  The history is provided by the patient and medical records.    Past Medical History:  Diagnosis Date   Allergy    Seasonal    Arthritis    Chicken pox    History of frequent urinary tract infections    Hyperlipidemia    Retrocalcaneal bursitis 08/26/2015   Rhomboid muscle strain, initial encounter 02/02/2019   Sinus headache    Stye 07/06/2019   Thyroid disease     Patient Active Problem List   Diagnosis Date Noted   Persistent cough for 3 weeks or longer 05/16/2023   Pruritus 05/16/2023   Allergic rhinitis 03/05/2023   Hyperlipidemia 01/18/2023   Prediabetes 08/25/2021   Preventative health care 01/09/2019   Anxiety 08/27/2018   Chronic back pain 02/28/2018   Herpes simplex 08/08/2016   Obesity (BMI 35.0-39.9 without comorbidity) 08/08/2016   Hypothyroidism 08/26/2015    Past Surgical History:  Procedure Laterality Date   TONSILLECTOMY AND ADENOIDECTOMY      OB History   No obstetric history on file.      Home Medications    Prior to Admission medications   Medication Sig Start Date End Date Taking? Authorizing Provider  trimethoprim-polymyxin b (POLYTRIM) ophthalmic solution Place 1 drop into the left eye 4 (four) times daily for 7 days. 06/08/23 06/15/23 Yes Mickie Bail, NP  beclomethasone  (QVAR REDIHALER) 80 MCG/ACT inhaler Inhale 1 puff into the lungs 2 (two) times daily. 05/23/23   [name], NP  levothyroxine (SYNTHROID) 125 MCG tablet TAKE 1 TABLET BY MOUTH EVERY MORNING ON AN EMPTY STOMACH WITH WATER ONLY. NO FOOD OR OTHER MEDICATIONS FOR 30 MINUTES. 03/17/23   [name], NP  montelukast (SINGULAIR) 10 MG tablet Take 1 tablet (10 mg total) by mouth at bedtime. For allergies 03/05/23   [name], NP  valACYclovir (VALTREX) 1000 MG tablet Take 1 tablet (1,000 mg total) by mouth daily as needed. 01/18/23   [name], NP    Family History Family History  Adopted: Yes    Social History Social History   Tobacco Use   Smoking status: Never   Smokeless tobacco: Never  Substance Use Topics   Alcohol use: Yes   Drug use: No     Allergies   Patient has no known allergies.   Review of Systems Review of Systems  Constitutional:  Negative for chills and fever.  Eyes:  Positive for pain, discharge, itching and visual disturbance. Negative for redness.  Skin:  Negative for color change and rash.     Physical Exam Triage Vital Signs ED Triage Vitals [06/08/23 1459]  Encounter Vitals Group     BP      Systolic BP Percentile  Diastolic BP Percentile      Pulse Rate 81     Resp 18     Temp 98.2 F (36.8 C)     Temp src      SpO2 97 %     Weight      Height      Head Circumference      Peak Flow      Pain Score      Pain Loc      Pain Education      Exclude from Growth Chart    No data found.  Updated Vital Signs BP 124/84   Pulse 81   Temp 98.2 F (36.8 C)   Resp 18   SpO2 97%   Visual Acuity Right Eye Distance: 20/40 Left Eye Distance:   Bilateral Distance: 20/50  Right Eye Near:   Left Eye Near:    Bilateral Near:     Physical Exam Constitutional:      General: She is not in acute distress. HENT:     Mouth/Throat:     Mouth: Mucous membranes are moist.  Eyes:     General:        Right eye: No  discharge.        Left eye: No discharge.     Extraocular Movements: Extraocular movements intact.     Conjunctiva/sclera: Conjunctivae normal.     Pupils: Pupils are equal, round, and reactive to light.     Comments: Flesh-colored tender stye on left upper eyelid.  No eye drainage.  Cardiovascular:     Rate and Rhythm: Normal rate and regular rhythm.  Pulmonary:     Effort: Pulmonary effort is normal. No respiratory distress.  Skin:    General: Skin is warm and dry.     Findings: No erythema.  Neurological:     Mental Status: She is alert.      UC Treatments / Results  Labs (all labs ordered are listed, but only abnormal results are displayed) Labs Reviewed - No data to display  EKG   Radiology No results found.  Procedures Procedures (including critical care time)  Medications Ordered in UC Medications - No data to display  Initial Impression / Assessment and Plan / UC Course  I have reviewed the triage vital signs and the nursing notes.  Pertinent labs & imaging results that were available during my care of the patient were reviewed by me and considered in my medical decision making (see chart for details).    Hordeolum of left upper eyelid, blurred vision in left eye.  Discussed limitations of evaluation of her symptoms in an urgent care setting.  She declines transfer to the ED at this time.  Treating with Polytrim eyedrops.  Instructed her to follow-up with an eye care provider on Monday.  Strict ED precautions given.  Education provided on stye and blurred vision.  Patient agrees to plan of care.  Final Clinical Impressions(s) / UC Diagnoses   Final diagnoses:  Hordeolum externum of left upper eyelid  Blurred vision, left eye     Discharge Instructions      Use the antibiotic eyedrops as prescribed.    Follow up with an eye care provider on Monday.  Go to the emergency department if you have worsening symptoms.        ED Prescriptions      Medication Sig Dispense Auth. Provider   trimethoprim-polymyxin b (POLYTRIM) ophthalmic solution Place 1 drop into the left eye 4 (four)  times daily for 7 days. 10 mL Mickie Bail, NP      PDMP not reviewed this encounter.   Mickie Bail, NP 06/08/23 1540

## 2023-06-24 ENCOUNTER — Other Ambulatory Visit: Payer: Self-pay | Admitting: Primary Care

## 2023-06-24 DIAGNOSIS — J309 Allergic rhinitis, unspecified: Secondary | ICD-10-CM

## 2024-01-14 ENCOUNTER — Encounter: Payer: Self-pay | Admitting: Primary Care

## 2024-01-14 ENCOUNTER — Ambulatory Visit: Admitting: Primary Care

## 2024-01-14 VITALS — BP 124/82 | HR 68 | Temp 97.2°F | Ht 67.0 in | Wt 248.0 lb

## 2024-01-14 DIAGNOSIS — R21 Rash and other nonspecific skin eruption: Secondary | ICD-10-CM | POA: Diagnosis not present

## 2024-01-14 DIAGNOSIS — R053 Chronic cough: Secondary | ICD-10-CM

## 2024-01-14 DIAGNOSIS — L299 Pruritus, unspecified: Secondary | ICD-10-CM | POA: Diagnosis not present

## 2024-01-14 MED ORDER — TRIAMCINOLONE ACETONIDE 0.5 % EX OINT
1.0000 | TOPICAL_OINTMENT | Freq: Two times a day (BID) | CUTANEOUS | 0 refills | Status: AC
Start: 2024-01-14 — End: ?

## 2024-01-14 MED ORDER — QVAR REDIHALER 80 MCG/ACT IN AERB: 1.0000 | INHALATION_SPRAY | Freq: Two times a day (BID) | RESPIRATORY_TRACT | 0 refills | Status: AC

## 2024-01-14 NOTE — Progress Notes (Signed)
 Subjective:    Patient ID: Lori Crawford, female    DOB: 27-Sep-1960, 63 y.o.   MRN: [id number]  HPI  Lori Crawford is a very pleasant 63 y.o. female with a history of allergic rhinitis, prediabetes, hypothyroidism, pruritus who presents today to discuss itchy skin and breathing.   1) Pruritus: About one year ago she was bitten by a mosquito, or what she thought to be a mosquito, to the right medial lower extremity proximal to the ankle. Since then she's noticed a hard, itchy mass to the site. She believes something is imbedded at the site. She's not applied anything OTC for her symptoms.   She's also noticed intermittent rounded tender spots to her lower extremities. She was once seeing dermatology, has not seen in a few years.   2) Allergic Rhinitis/Shortness of Breath: Chronic and intermittent. Evaluated last Fall for chronic and persistent cough. At the time she was prescribed Qvar inhaler BID which helped quite a bit. She is managed on Singulair  10 mg HS year round.   Since then she's noticed coughing, wheezing, and shortness of breath in various scenarios including with scents, when outdoors, or when near the air conditioner. She notices that she wheezes at night. She denies a history of asthma, even as a child.   Review of Systems  Constitutional:  Negative for fever.  Respiratory:  Positive for cough, shortness of breath and wheezing.   Skin:  Positive for color change and rash. Negative for wound.         Past Medical History:  Diagnosis Date   Allergy    Seasonal    Arthritis    Chicken pox    History of frequent urinary tract infections    Hyperlipidemia    Retrocalcaneal bursitis 08/26/2015   Rhomboid muscle strain, initial encounter 02/02/2019   Sinus headache    Stye 07/06/2019   Thyroid  disease     Social History   Socioeconomic History   Marital status: Single    Spouse name: Not on file   Number of children: Not on file   Years of education: Not on file    Highest education level: Not on file  Occupational History   Not on file  Tobacco Use   Smoking status: Never   Smokeless tobacco: Never  Substance and Sexual Activity   Alcohol use: Yes   Drug use: No   Sexual activity: Not on file  Other Topics Concern   Not on file  Social History Narrative   Not on file   Social Drivers of Health   Financial Resource Strain: Not on file  Food Insecurity: Not on file  Transportation Needs: Not on file  Physical Activity: Not on file  Stress: Not on file  Social Connections: Not on file  Intimate Partner Violence: Not on file    Past Surgical History:  Procedure Laterality Date   TONSILLECTOMY AND ADENOIDECTOMY      Family History  Adopted: Yes    No Known Allergies  Current Outpatient Medications on File Prior to Visit  Medication Sig Dispense Refill   levothyroxine  (SYNTHROID ) 125 MCG tablet TAKE 1 TABLET BY MOUTH EVERY MORNING ON AN EMPTY STOMACH WITH WATER ONLY. NO FOOD OR OTHER MEDICATIONS FOR 30 MINUTES. 90 tablet 2   montelukast  (SINGULAIR ) 10 MG tablet TAKE 1 TABLET (10 MG TOTAL) BY MOUTH AT BEDTIME. FOR ALLERGIES 90 tablet 1   valACYclovir  (VALTREX ) 1000 MG tablet Take 1 tablet (1,000 mg total) by mouth daily as  needed. 30 tablet 0   No current facility-administered medications on file prior to visit.    BP 124/82   Pulse 68   Temp (!) 97.2 F (36.2 C) (Temporal)   Ht 5\' 7"  (1.702 m)   Wt 248 lb (112.5 kg)   SpO2 98%   BMI 38.84 kg/m  Objective:   Physical Exam Cardiovascular:     Rate and Rhythm: Normal rate and regular rhythm.  Pulmonary:     Effort: Pulmonary effort is normal.     Breath sounds: Normal breath sounds.  Musculoskeletal:     Cervical back: Neck supple.  Skin:    General: Skin is warm and dry.     Comments: Area of hyperpigmented, tough skin to right medial lower extremity proximal to ankle. Non tender. No warmth or erythema.   Neurological:     Mental Status: She is alert and oriented  to person, place, and time.  Psychiatric:        Mood and Affect: Mood normal.           Assessment & Plan:  Rash and nonspecific skin eruption -     Triamcinolone Acetonide; Apply 1 Application topically 2 (two) times daily.  Dispense: 30 g; Refill: 0  Persistent cough for 3 weeks or longer Assessment & Plan: Intermittent, does seem to be seasonal or situational.   Recommended and offered pulmonary function testing for formal evaluation of asthma, she declines. Resume Qvar inhaler 1 puff twice daily as this was highly effective. Continue singular 10 mg at bedtime.  She will update.  Orders: -     Qvar RediHaler; Inhale 1 puff into the lungs 2 (two) times daily.  Dispense: 1 each; Refill: 0  Pruritus Assessment & Plan: Exam today with hyperpigmented area from prior skin insult but also scratching. No apparent infection, and would doubt infection x 1 year without other symptoms.   She will contact her dermatology provider for an appointment. Start triamcinolone 0.5% ointment BID x 1-2 weeks.           Courteny Egler K Falicity Sheets, NP

## 2024-01-14 NOTE — Assessment & Plan Note (Signed)
 Intermittent, does seem to be seasonal or situational.   Recommended and offered pulmonary function testing for formal evaluation of asthma, she declines. Resume Qvar inhaler 1 puff twice daily as this was highly effective. Continue singular 10 mg at bedtime.  She will update.

## 2024-01-14 NOTE — Patient Instructions (Signed)
 Resume your Qvar inhaler.  Inhale 1 puff into the lungs twice daily.  Rinse your mouth after each use.  You may apply the triamcinolone ointment twice daily for 1 to 2 weeks.  Please call your dermatologist to schedule appointment.  It was a pleasure to see you today!

## 2024-01-14 NOTE — Assessment & Plan Note (Signed)
 Exam today with hyperpigmented area from prior skin insult but also scratching. No apparent infection, and would doubt infection x 1 year without other symptoms.   She will contact her dermatology provider for an appointment. Start triamcinolone 0.5% ointment BID x 1-2 weeks.

## 2024-02-13 ENCOUNTER — Other Ambulatory Visit: Payer: Self-pay | Admitting: Primary Care

## 2024-02-13 DIAGNOSIS — E039 Hypothyroidism, unspecified: Secondary | ICD-10-CM

## 2024-03-20 ENCOUNTER — Encounter: Admitting: Primary Care

## 2024-06-15 ENCOUNTER — Telehealth: Payer: Self-pay | Admitting: Primary Care

## 2024-06-15 ENCOUNTER — Other Ambulatory Visit: Payer: Self-pay | Admitting: *Deleted

## 2024-06-15 DIAGNOSIS — E039 Hypothyroidism, unspecified: Secondary | ICD-10-CM

## 2024-06-15 MED ORDER — LEVOTHYROXINE SODIUM 125 MCG PO TABS: ORAL_TABLET | ORAL | 0 refills | Status: DC

## 2024-06-15 NOTE — Progress Notes (Unsigned)
 Patient is overdue for follow up. Please schedule CPE for as soon as possible.

## 2024-06-15 NOTE — Telephone Encounter (Signed)
 Patient is overdue for CPE/follow up, this will be required prior to any further refills.  Please schedule for as soon as possible, thank you!

## 2024-06-19 ENCOUNTER — Encounter: Payer: Self-pay | Admitting: Primary Care

## 2024-06-19 ENCOUNTER — Ambulatory Visit (INDEPENDENT_AMBULATORY_CARE_PROVIDER_SITE_OTHER): Admitting: Primary Care

## 2024-06-19 VITALS — BP 118/70 | HR 88 | Temp 98.2°F | Ht 66.25 in | Wt 248.2 lb

## 2024-06-19 DIAGNOSIS — Z0001 Encounter for general adult medical examination with abnormal findings: Secondary | ICD-10-CM

## 2024-06-19 DIAGNOSIS — E890 Postprocedural hypothyroidism: Secondary | ICD-10-CM

## 2024-06-19 DIAGNOSIS — J309 Allergic rhinitis, unspecified: Secondary | ICD-10-CM

## 2024-06-19 DIAGNOSIS — F419 Anxiety disorder, unspecified: Secondary | ICD-10-CM

## 2024-06-19 DIAGNOSIS — R7303 Prediabetes: Secondary | ICD-10-CM

## 2024-06-19 DIAGNOSIS — R600 Localized edema: Secondary | ICD-10-CM | POA: Diagnosis not present

## 2024-06-19 DIAGNOSIS — Z1211 Encounter for screening for malignant neoplasm of colon: Secondary | ICD-10-CM

## 2024-06-19 DIAGNOSIS — E785 Hyperlipidemia, unspecified: Secondary | ICD-10-CM

## 2024-06-19 DIAGNOSIS — B009 Herpesviral infection, unspecified: Secondary | ICD-10-CM

## 2024-06-19 LAB — BRAIN NATRIURETIC PEPTIDE: Brain Natriuretic Peptide: 18 pg/mL (ref <100)

## 2024-06-19 NOTE — Patient Instructions (Signed)
 Stop by the lab prior to leaving today. I will notify you of your results once received.   Start wearing compression socks/stockings every day while awake.  Elevate your legs anytime you are sitting.  Work on weight loss through exercise and healthy diet.  It was a pleasure to see you today!  Preventive Care 40-61 Years Old, Female Preventive care refers to lifestyle choices and visits with your health care provider that can promote health and wellness. Preventive care visits are also called wellness exams. What can I expect for my preventive care visit? Counseling Your health care provider may ask you questions about your: Medical history, including: Past medical problems. Family medical history. Pregnancy history. Current health, including: Menstrual cycle. Method of birth control. Emotional well-being. Home life and relationship well-being. Sexual activity and sexual health. Lifestyle, including: Alcohol, nicotine or tobacco, and drug use. Access to firearms. Diet, exercise, and sleep habits. Work and work astronomer. Sunscreen use. Safety issues such as seatbelt and bike helmet use. Physical exam Your health care provider will check your: Height and weight. These may be used to calculate your BMI (body mass index). BMI is a measurement that tells if you are at a healthy weight. Waist circumference. This measures the distance around your waistline. This measurement also tells if you are at a healthy weight and may help predict your risk of certain diseases, such as type 2 diabetes and high blood pressure. Heart rate and blood pressure. Body temperature. Skin for abnormal spots. What immunizations do I need?  Vaccines are usually given at various ages, according to a schedule. Your health care provider will recommend vaccines for you based on your age, medical history, and lifestyle or other factors, such as travel or where you work. What tests do I need? Screening Your  health care provider may recommend screening tests for certain conditions. This may include: Lipid and cholesterol levels. Diabetes screening. This is done by checking your blood sugar (glucose) after you have not eaten for a while (fasting). Pelvic exam and Pap test. Hepatitis B test. Hepatitis C test. HIV (human immunodeficiency virus) test. STI (sexually transmitted infection) testing, if you are at risk. Lung cancer screening. Colorectal cancer screening. Mammogram. Talk with your health care provider about when you should start having regular mammograms. This may depend on whether you have a family history of breast cancer. BRCA-related cancer screening. This may be done if you have a family history of breast, ovarian, tubal, or peritoneal cancers. Bone density scan. This is done to screen for osteoporosis. Talk with your health care provider about your test results, treatment options, and if necessary, the need for more tests. Follow these instructions at home: Eating and drinking  Eat a diet that includes fresh fruits and vegetables, whole grains, lean protein, and low-fat dairy products. Take vitamin and mineral supplements as recommended by your health care provider. Do not drink alcohol if: Your health care provider tells you not to drink. You are pregnant, may be pregnant, or are planning to become pregnant. If you drink alcohol: Limit how much you have to 0-1 drink a day. Know how much alcohol is in your drink. In the [place] one drink equals one 12 oz bottle of beer (355 mL), one 5 oz glass of wine (148 mL), or one 1 oz glass of hard liquor (44 mL). Lifestyle Brush your teeth every morning and night with fluoride toothpaste. Floss one time each day. Exercise for at least 30 minutes 5 or more days  each week. Do not use any products that contain nicotine or tobacco. These products include cigarettes, chewing tobacco, and vaping devices, such as e-cigarettes. If you need help  quitting, ask your health care provider. Do not use drugs. If you are sexually active, practice safe sex. Use a condom or other form of protection to prevent STIs. If you do not wish to become pregnant, use a form of birth control. If you plan to become pregnant, see your health care provider for a prepregnancy visit. Take aspirin only as told by your health care provider. Make sure that you understand how much to take and what form to take. Work with your health care provider to find out whether it is safe and beneficial for you to take aspirin daily. Find healthy ways to manage stress, such as: Meditation, yoga, or listening to music. Journaling. Talking to a trusted person. Spending time with friends and family. Minimize exposure to UV radiation to reduce your risk of skin cancer. Safety Always wear your seat belt while driving or riding in a vehicle. Do not drive: If you have been drinking alcohol. Do not ride with someone who has been drinking. When you are tired or distracted. While texting. If you have been using any mind-altering substances or drugs. Wear a helmet and other protective equipment during sports activities. If you have firearms in your house, make sure you follow all gun safety procedures. Seek help if you have been physically or sexually abused. What's next? Visit your health care provider once a year for an annual wellness visit. Ask your health care provider how often you should have your eyes and teeth checked. Stay up to date on all vaccines. This information is not intended to replace advice given to you by your health care provider. Make sure you discuss any questions you have with your health care provider. Document Revised: 01/18/2021 Document Reviewed: 01/18/2021 Elsevier Patient Education  2024 [employer]   Chronic Venous Insufficiency Chronic venous insufficiency is a condition that causes the veins in the legs to struggle to pump blood from the  legs to the heart. It is also called venous stasis. This condition can happen when the vein walls are stretched, weakened, or damaged. It can also happen when the valves inside the vein are damaged. With the right treatment, you should be able to still lead an active life. What are the causes? Common causes of this condition include: Venous hypertension. This is high blood pressure inside the veins. Sitting or standing too long. This can cause increased blood pressure in the veins of the leg. Deep vein thrombosis (DVT). This is a blood clot that blocks blood flow in a vein. Phlebitis. This is inflammation of a vein. It can cause a blood clot to form. An abnormal growth of cells (tumor) in the area between your hip bones (pelvis). This can cause blood to back up. What increases the risk? Factors that may make you more likely to get this condition include: Having a family history of the condition. Being overweight. Being pregnant. Not getting enough exercise. Smoking. Having a job that requires you to sit or stand in one place for a long time. Being a certain age. Females in their 22s and 110s and males in their 77s are more likely to get this condition. What are the signs or symptoms? Symptoms of this condition include: Varicose veins. These are veins that are enlarged, bulging, or twisted. Skin breakdown or ulcers. Reddened skin or dark discoloration of  the skin on the leg between the knee and ankle. Lipodermatosclerosis. This is brown, smooth, tight, and painful skin just above the ankle. It is often on the inside of the leg. Swelling of the legs. How is this diagnosed? This condition may be diagnosed based on your medical history and a physical exam. You may also need tests, such as: A duplex ultrasound. This shows how blood flows through a blood vessel. Plethysmography. This tests blood flow. Venogram. This looks at the veins using an X-ray and dye. How is this treated? The goals of  treatment are to help you return to an active life and to relieve pain. Treatment may include: Wearing compression stockings. These do not cure the condition but can help relieve symptoms. They can also help stop your condition from getting worse. Sclerotherapy. This involves injecting a solution to shrink damaged veins. Surgery. This may include: Vein stripping. This is when a diseased vein is taken out. Laser ablation surgery. This is when blood flow is cut off through the vein. Repairing or remaking a valve inside the affected vein. Follow these instructions at home: Lifestyle Do not use any products that contain nicotine or tobacco. These products include cigarettes, chewing tobacco, and vaping devices, such as e-cigarettes. If you need help quitting, ask your health care provider. Stay active. Exercise, walk, or do other activities. Ask your provider what activities are safe for you. General instructions Take over-the-counter and prescription medicines only as told by your provider. Drink enough fluid to keep your pee (urine) pale yellow. Wear compression stockings as told by your provider. These stockings help to prevent blood clots and reduce swelling in your legs. Keep all follow-up visits. Your provider will check your legs for any changes and adjust your treatment plan as needed. Contact a health care provider if: You have redness, swelling, or more pain in the affected area. You see a red streak or line that goes up or down from the area. You have skin breakdown or skin loss. You get an injury in the affected area. You get a fever. Get help right away if: You have severe pain that does not get better with medicine. You get an injury and an open wound in the affected area. Your foot or ankle becomes numb or weak all of a sudden. You have trouble moving your foot or ankle. Your symptoms do not go away or get worse. You have chest pain. You have shortness of breath. These  symptoms may be an emergency. Get help right away. Call 911. Do not wait to see if the symptoms will go away. Do not drive yourself to the hospital. This information is not intended to replace advice given to you by your health care provider. Make sure you discuss any questions you have with your health care provider. Document Revised: 08/07/2022 Document Reviewed: 08/07/2022 Elsevier Patient Education  2024 [employer].

## 2024-06-19 NOTE — Assessment & Plan Note (Signed)
 Denies concerns today. Continue to monitor.

## 2024-06-19 NOTE — Assessment & Plan Note (Signed)
 Declines influenza vaccine. Discussed Shingrix vaccine. Mammogram overdue.  She continues to decline despite recommendations. Colonoscopy overdue, she declines but opts for Cologuard.  Orders placed.  Discussed the importance of a healthy diet and regular exercise in order for weight loss, and to reduce the risk of further co-morbidity.  Exam stable. Labs pending.  Follow up in 1 year for repeat physical.

## 2024-06-19 NOTE — Assessment & Plan Note (Signed)
 Repeat A1c pending

## 2024-06-19 NOTE — Assessment & Plan Note (Signed)
No recent outbreaks.  Continue Valtrex 1000 mg PRN 

## 2024-06-19 NOTE — Addendum Note (Signed)
 Addended by: Syria Kestner K on: 06/19/2024 03:07 PM   Modules accepted: Orders

## 2024-06-19 NOTE — Assessment & Plan Note (Signed)
 Repeat lipid panel pending.

## 2024-06-19 NOTE — Assessment & Plan Note (Signed)
 She is taking levothyroxine correctly. Repeat TSH pending.  Continue levothyroxine 125 mcg daily.

## 2024-06-19 NOTE — Assessment & Plan Note (Addendum)
 Differentials include venous insufficiency, lymphedema. Lower suspicion for DVT.  Checking labs today including BNP, CMP. Venous duplex studies ordered and pending.  Recommended compression stockings, elevation, increased physical activity. Recommended weight loss

## 2024-06-19 NOTE — Progress Notes (Signed)
 Subjective:    Patient ID: Lori Crawford, female    DOB: 27-Aug-1960, 63 y.o.   MRN: [id number]  Lori Crawford is a very pleasant 63 y.o. female who presents today for complete physical and follow up of chronic conditions.  She would also like to discuss a few concerns.  She was bitten by a mosquito in Summer 2024. Since then she's noticed a large rounded hyperpigmented area to the right medial lower extremity proximal to the ankle. The sore is itchy and firm. She will sometimes notice sharp shooting pain from the site. This Summer she noticed the same type of discoloration,itching, and firmness to the left medial lower extremity proximal to ankle.  She spends most of her day sitting at a desk, tries to elevate her legs when possible.  She does not wear compression socks.  She has not exercising and has gained weight over the years.  She denies shortness of breath except for with moderate exertion which she attributes to deconditioning.  She denies chest pain, cough.   Immunizations: -Tetanus: Completed in 2020 -Influenza: Declines influenza vaccine.  -Shingles: Never completed Shingrix series  Diet: Fair diet.  Exercise: No regular exercise.  Eye exam: Completes annually  Dental exam: Completes semi-annually    Pap Smear: Hysterectomy  Mammogram: Completed in 2012  Colonoscopy: Completed >10 years ago, incomplete prep, declines.   BP Readings from Last 3 Encounters:  06/19/24 118/70  01/14/24 124/82  06/08/23 124/84   Wt Readings from Last 3 Encounters:  06/19/24 248 lb 4 oz (112.6 kg)  01/14/24 248 lb (112.5 kg)  05/16/23 252 lb (114.3 kg)       Review of Systems  Constitutional:  Negative for unexpected weight change.  HENT:  Negative for rhinorrhea.   Respiratory:  Negative for cough and shortness of breath.   Cardiovascular:  Positive for leg swelling. Negative for chest pain.  Gastrointestinal:  Negative for constipation and diarrhea.  Genitourinary:  Negative  for difficulty urinating.  Musculoskeletal:  Negative for arthralgias and myalgias.  Skin:  Positive for color change.  Allergic/Immunologic: Negative for environmental allergies.  Neurological:  Negative for dizziness, numbness and headaches.  Psychiatric/Behavioral:  The patient is not nervous/anxious.          Past Medical History:  Diagnosis Date   Allergy    Seasonal    Arthritis 2020   Chicken pox    History of frequent urinary tract infections    Hyperlipidemia    Retrocalcaneal bursitis 08/26/2015   Rhomboid muscle strain, initial encounter 02/02/2019   Sinus headache    Stye 07/06/2019   Thyroid  disease     Social History   Socioeconomic History   Marital status: Single    Spouse name: Not on file   Number of children: Not on file   Years of education: Not on file   Highest education level: Not on file  Occupational History   Not on file  Tobacco Use   Smoking status: Never   Smokeless tobacco: Never  Substance and Sexual Activity   Alcohol use: Yes   Drug use: No   Sexual activity: Not on file  Other Topics Concern   Not on file  Social History Narrative   Not on file   Social Drivers of Health   Financial Resource Strain: Not on file  Food Insecurity: Not on file  Transportation Needs: Not on file  Physical Activity: Not on file  Stress: Not on file  Social Connections: Not on file  Intimate Partner Violence: Not on file    Past Surgical History:  Procedure Laterality Date   ABDOMINAL HYSTERECTOMY  2004   TONSILLECTOMY AND ADENOIDECTOMY      Family History  Adopted: Yes    No Known Allergies  Current Outpatient Medications on File Prior to Visit  Medication Sig Dispense Refill   beclomethasone (QVAR  REDIHALER) 80 MCG/ACT inhaler Inhale 1 puff into the lungs 2 (two) times daily. 1 each 0   levothyroxine  (SYNTHROID ) 125 MCG tablet 1 TAB BY MOUTH IN AM ON AN EMPTY STOMACH WITH WATER ONLY. NO FOOD OR OTHER MEDS FOR 30 MINUTES. 30 tablet  0   triamcinolone  ointment (KENALOG ) 0.5 % Apply 1 Application topically 2 (two) times daily. 30 g 0   valACYclovir  (VALTREX ) 1000 MG tablet Take 1 tablet (1,000 mg total) by mouth daily as needed. 30 tablet 0   No current facility-administered medications on file prior to visit.    BP 118/70   Pulse 88   Temp 98.2 F (36.8 C) (Oral)   Ht 5' 6.25 (1.683 m)   Wt 248 lb 4 oz (112.6 kg)   SpO2 95%   BMI 39.77 kg/m  Objective:   Physical Exam HENT:     Right Ear: Tympanic membrane and ear canal normal.     Left Ear: Tympanic membrane and ear canal normal.  Eyes:     Pupils: Pupils are equal, round, and reactive to light.  Cardiovascular:     Rate and Rhythm: Normal rate and regular rhythm.     Pulses:          Dorsalis pedis pulses are 1+ on the right side and 1+ on the left side.       Posterior tibial pulses are 1+ on the right side and 1+ on the left side.     Comments: Moderate bilateral lower extremity edema extending to ankles, left greater than right.  1+ pitting bilaterally.  Hyperpigmented circles to each bilateral lower extremity as mentioned in HPI.  Firm skin.  No surrounding erythema. Pulmonary:     Effort: Pulmonary effort is normal.     Breath sounds: Normal breath sounds.  Abdominal:     General: Bowel sounds are normal.     Palpations: Abdomen is soft.     Tenderness: There is no abdominal tenderness.  Musculoskeletal:        General: Normal range of motion.     Cervical back: Neck supple.  Skin:    General: Skin is warm and dry.  Neurological:     Mental Status: She is alert and oriented to person, place, and time.     Cranial Nerves: No cranial nerve deficit.     Deep Tendon Reflexes:     Reflex Scores:      Patellar reflexes are 2+ on the right side and 2+ on the left side. Psychiatric:        Mood and Affect: Mood normal.     Physical Exam        Assessment & Plan:  Encounter for annual general medical examination with abnormal findings in  adult Assessment & Plan: Declines influenza vaccine. Discussed Shingrix vaccine. Mammogram overdue.  She continues to decline despite recommendations. Colonoscopy overdue, she declines but opts for Cologuard.  Orders placed.  Discussed the importance of a healthy diet and regular exercise in order for weight loss, and to reduce the risk of further co-morbidity.  Exam stable. Labs pending.  Follow up in 1 year for  repeat physical.    Postoperative hypothyroidism Assessment & Plan: She is taking levothyroxine  correctly. Repeat TSH pending.  Continue levothyroxine  125 mcg daily   Allergic rhinitis, unspecified seasonality, unspecified trigger Assessment & Plan: Controlled.  Continue Singulair  10 mg daily PRN Continue Zyrtec 10 mg daily.   Herpes simplex Assessment & Plan: No recent outbreaks.  Continue Valtrex  1000 mg PRN   Bilateral lower extremity edema Assessment & Plan: Differentials include venous insufficiency, lymphedema. Lower suspicion for DVT.  Checking labs today including BNP, CMP. Venous duplex studies ordered and pending.  Recommended compression stockings, elevation, increased physical activity. Recommended weight loss  Orders: -     VAS US  LOWER EXTREMITY VENOUS (DVT); Future -     Brain natriuretic peptide  Hyperlipidemia, unspecified hyperlipidemia type Assessment & Plan: Repeat lipid panel pending  Orders: -     Comprehensive metabolic panel with GFR -     Lipid panel  Prediabetes Assessment & Plan: Repeat A1c pending  Orders: -     Hemoglobin A1c  Anxiety Assessment & Plan: Denies concerns today. Continue to monitor.   Screening for colon cancer -     Cologuard    Assessment and Plan Assessment & Plan         Comer MARLA Gaskins, NP    History of Present Illness

## 2024-06-19 NOTE — Assessment & Plan Note (Addendum)
 Controlled.  Continue Singulair  10 mg daily PRN Continue Zyrtec 10 mg daily.

## 2024-06-20 LAB — COMPREHENSIVE METABOLIC PANEL WITH GFR
AG Ratio: 1.3 (calc) (ref 1.0–2.5)
ALT: 14 U/L (ref 6–29)
AST: 14 U/L (ref 10–35)
Albumin: 4.1 g/dL (ref 3.6–5.1)
Alkaline phosphatase (APISO): 68 U/L (ref 37–153)
BUN: 16 mg/dL (ref 7–25)
CO2: 26 mmol/L (ref 20–32)
Calcium: 8.7 mg/dL (ref 8.6–10.4)
Chloride: 106 mmol/L (ref 98–110)
Creat: 0.96 mg/dL (ref 0.50–1.05)
Globulin: 3.1 g/dL (ref 1.9–3.7)
Glucose, Bld: 88 mg/dL (ref 65–99)
Potassium: 4 mmol/L (ref 3.5–5.3)
Sodium: 140 mmol/L (ref 135–146)
Total Bilirubin: 0.3 mg/dL (ref 0.2–1.2)
Total Protein: 7.2 g/dL (ref 6.1–8.1)
eGFR: 67 mL/min/1.73m2 (ref >=60)

## 2024-06-20 LAB — LIPID PANEL
Cholesterol: 248 mg/dL — ABNORMAL HIGH (ref <200)
HDL: 72 mg/dL (ref >=50)
LDL Cholesterol (Calc): 145 mg/dL — ABNORMAL HIGH
Non-HDL Cholesterol (Calc): 176 mg/dL — ABNORMAL HIGH (ref <130)
Total CHOL/HDL Ratio: 3.4 (calc) (ref <5.0)
Triglycerides: 173 mg/dL — ABNORMAL HIGH (ref <150)

## 2024-06-20 LAB — HEMOGLOBIN A1C
Hgb A1c MFr Bld: 5.6 % (ref <5.7)
Mean Plasma Glucose: 114 mg/dL
eAG (mmol/L): 6.3 mmol/L

## 2024-06-20 LAB — TSH: TSH: 1.78 m[IU]/L (ref 0.40–4.50)

## 2024-06-21 ENCOUNTER — Ambulatory Visit: Payer: Self-pay | Admitting: Primary Care

## 2024-06-21 DIAGNOSIS — R600 Localized edema: Secondary | ICD-10-CM

## 2024-06-23 ENCOUNTER — Ambulatory Visit (HOSPITAL_COMMUNITY)
Admission: RE | Admit: 2024-06-23 | Discharge: 2024-06-23 | Disposition: A | Discharge status: Home / Self Care | Source: Ambulatory Visit | Attending: Vascular Surgery | Admitting: Vascular Surgery

## 2024-06-23 DIAGNOSIS — R600 Localized edema: Secondary | ICD-10-CM | POA: Diagnosis not present

## 2024-08-01 ENCOUNTER — Other Ambulatory Visit: Payer: Self-pay | Admitting: Primary Care

## 2024-08-01 DIAGNOSIS — E039 Hypothyroidism, unspecified: Secondary | ICD-10-CM
